# Patient Record
Sex: Female | Born: 1960 | Race: Black or African American | Hispanic: No | Marital: Single | Smoking: Current some day smoker
Health system: Southern US, Community
[De-identification: ages and names within clinical notes are randomized; demographics above are authoritative.]

## PROBLEM LIST (undated history)

## (undated) DIAGNOSIS — F1010 Alcohol abuse, uncomplicated: Secondary | ICD-10-CM

---

## 2001-03-07 ENCOUNTER — Emergency Department (HOSPITAL_COMMUNITY): Admission: EM | Admit: 2001-03-07 | Discharge: 2001-03-07 | Payer: Self-pay | Admitting: Emergency Medicine

## 2001-03-07 ENCOUNTER — Encounter: Payer: Self-pay | Admitting: Emergency Medicine

## 2003-02-20 ENCOUNTER — Emergency Department (HOSPITAL_COMMUNITY): Admission: EM | Admit: 2003-02-20 | Discharge: 2003-02-20 | Payer: Self-pay | Admitting: Internal Medicine

## 2003-07-19 ENCOUNTER — Encounter: Payer: Self-pay | Admitting: Internal Medicine

## 2003-07-19 ENCOUNTER — Emergency Department (HOSPITAL_COMMUNITY): Admission: EM | Admit: 2003-07-19 | Discharge: 2003-07-19 | Payer: Self-pay | Admitting: Internal Medicine

## 2003-08-06 ENCOUNTER — Ambulatory Visit (HOSPITAL_COMMUNITY): Admission: RE | Admit: 2003-08-06 | Discharge: 2003-08-06 | Payer: Self-pay | Admitting: Orthopaedic Surgery

## 2005-02-27 ENCOUNTER — Emergency Department (HOSPITAL_COMMUNITY): Admission: EM | Admit: 2005-02-27 | Discharge: 2005-02-27 | Payer: Self-pay | Admitting: Emergency Medicine

## 2005-03-20 ENCOUNTER — Emergency Department (HOSPITAL_COMMUNITY): Admission: EM | Admit: 2005-03-20 | Discharge: 2005-03-20 | Payer: Self-pay | Admitting: Emergency Medicine

## 2005-03-22 ENCOUNTER — Emergency Department (HOSPITAL_COMMUNITY): Admission: EM | Admit: 2005-03-22 | Discharge: 2005-03-22 | Payer: Self-pay | Admitting: Emergency Medicine

## 2005-03-24 ENCOUNTER — Inpatient Hospital Stay (HOSPITAL_COMMUNITY): Admission: EM | Admit: 2005-03-24 | Discharge: 2005-03-27 | Payer: Self-pay | Admitting: Emergency Medicine

## 2007-10-30 ENCOUNTER — Emergency Department (HOSPITAL_COMMUNITY): Admission: EM | Admit: 2007-10-30 | Discharge: 2007-10-30 | Payer: Self-pay | Admitting: Emergency Medicine

## 2008-09-08 ENCOUNTER — Emergency Department (HOSPITAL_COMMUNITY): Admission: EM | Admit: 2008-09-08 | Discharge: 2008-09-08 | Payer: Self-pay | Admitting: Emergency Medicine

## 2009-04-03 ENCOUNTER — Emergency Department (HOSPITAL_COMMUNITY): Admission: EM | Admit: 2009-04-03 | Discharge: 2009-04-03 | Payer: Self-pay | Admitting: Emergency Medicine

## 2011-02-05 ENCOUNTER — Emergency Department (HOSPITAL_COMMUNITY)
Admission: EM | Admit: 2011-02-05 | Discharge: 2011-02-05 | Disposition: A | Discharge status: Home / Self Care | Payer: Self-pay | Attending: Emergency Medicine | Admitting: Emergency Medicine

## 2011-02-05 DIAGNOSIS — L089 Local infection of the skin and subcutaneous tissue, unspecified: Secondary | ICD-10-CM | POA: Insufficient documentation

## 2011-02-05 DIAGNOSIS — L299 Pruritus, unspecified: Secondary | ICD-10-CM | POA: Insufficient documentation

## 2011-02-13 ENCOUNTER — Emergency Department (HOSPITAL_COMMUNITY)
Admission: EM | Admit: 2011-02-13 | Discharge: 2011-02-13 | Disposition: A | Discharge status: Home / Self Care | Payer: Self-pay | Attending: Emergency Medicine | Admitting: Emergency Medicine

## 2011-02-13 DIAGNOSIS — N644 Mastodynia: Secondary | ICD-10-CM | POA: Insufficient documentation

## 2011-02-15 ENCOUNTER — Other Ambulatory Visit (HOSPITAL_COMMUNITY): Payer: Self-pay

## 2011-02-19 ENCOUNTER — Emergency Department (HOSPITAL_COMMUNITY)
Admission: EM | Admit: 2011-02-19 | Discharge: 2011-02-19 | Disposition: A | Discharge status: Home / Self Care | Payer: Self-pay | Attending: Emergency Medicine | Admitting: Emergency Medicine

## 2011-02-19 DIAGNOSIS — N61 Mastitis without abscess: Secondary | ICD-10-CM | POA: Insufficient documentation

## 2011-02-19 DIAGNOSIS — R599 Enlarged lymph nodes, unspecified: Secondary | ICD-10-CM | POA: Insufficient documentation

## 2011-02-26 NOTE — H&P (Signed)
Rebecca Hurst, Rebecca Hurst                ACCOUNT NO.:  [id number]   MEDICAL RECORD NO.:  [id number]          PATIENT TYPE:  EMS   LOCATION:  ED                            FACILITY:  [facility]   PHYSICIAN:  Osvaldo Shipper, MD     DATE OF BIRTH:  1961/09/06   DATE OF ADMISSION:  03/24/2005  DATE OF DISCHARGE:  LH                                HISTORY & PHYSICAL   ADMISSION DIAGNOSES:  1.  Left breast abscess/cellulitis.  2.  Alcohol abuse.   FAMILY MEDICAL DOCTOR:  Dr. Elpidio Anis   CHIEF COMPLAINT:  Pain, left breast for about two weeks.   HISTORY OF PRESENT ILLNESS:  The patient is a 50 year old African-American  female with a history of alcohol abuse and no other medical problems who  actually presented to the emergency department on 5/20 complaining of pain  and swelling in her left breast.  According to the patient, she noticed some  pain in the upper part of her left breast and noticed that she had about a 2  cm mass on that side.  The mass was tender to palpation.  She presented to  the ED where she was prescribed Keflex and was asked to follow up with Dr.  [name] who is her primary care doctor.  However, the patient did not fill her  prescriptions and returned to the emergency department on 6/10, once again  complaining of pain in the left breast as well as increase in the swelling  of the lesion.  Once again she was prescribed an antibiotic which again she  did not fill.  She returned again on 6/12 resulting in a similar outcome.   The patient returned today because her swelling seemed to have gotten worse.  The pain had increased substantially and this morning the swelling burst and  she had pus discharge with a little bit of blood in it.  The patient had not  noticed any fever or chills at home.  The patient gives a history of similar  lesions on the right side a few years ago requiring incision and drainage by  Dr. [name].  The patient stated her last mammogram was in 1998 when she  was  in a prison and has not had one since then.  The patient has not noticed any  other lymphadenopathy anywhere else.  She denies any weight loss at this  time.   The patient also denies any IV drug use or any other form of needle stick.  She does not have a history of diabetes and has never been told that she has  high sugars.   MEDICATIONS:  She is on no medications currently.  As mentioned above she  was supposed to take Keflex and clindamycin however, she has not been taking  them.   ALLERGIES:  The patient is not allergic to any medications.   PAST MEDICAL HISTORY:  Quite unremarkable, otherwise.  She does have a  history of alcohol use in significant quantities in the past.   SOCIAL HISTORY:  The patient lives with her sister.  She is  currently  unemployed.  She lives in [place].  She smokes about a pack of cigarettes  per day and has about a 40 pack-year history of smoking.  She states she  drinks beer however, she mentioned that she has not had any beer in the last  10 days or so.  She denies any other liquor use.  She denies any illicit  drug.   FAMILY HISTORY:  Significant for a father who died at the age of 65 for what  she thinks was tuberculosis.  She also has a mother who is alive with  hypertension and stroke.  No history of any cancer.  The patient does not  have any other family history of cancer in her primary or secondary family.  No history of any lung disease or heart disease in the family.   REVIEW OF SYSTEMS:  A 10-point review of systems was done which was negative  and unremarkable.   PHYSICAL EXAMINATION:  VITAL SIGNS: Temperature is 98.4, blood pressure  133/76, heart rate of 73, respiratory rate 16, saturating 98% on room air.  GENERAL EXAM: This is an obese African-American female in slight discomfort  because of the pain in her left breast.  HEENT: There is no pilonidal icterus.  Oral mucous membrane is moist, no  oral lesions were seen.   LUNGS: Clear to auscultation bilaterally.  No wheezing, crackles or rhonchi  are heard.  CARDIOVASCULAR: S1 and S2 is normal, regular, no murmurs appreciated.  No  bruits are heard.  No JVD is seen.  LYMPHATIC SYSTEM: No axillary cervical or supraclavicular lymphadenopathy is  appreciated.  ABDOMEN: Soft, nontender, nondistended, obese, bowel sounds are present.  No  organomegaly or masses present.  EXTREMITIES: Without edema.  Peripheral pulses are all palpable.  BREAST EXAM: The left breast was noted to be swollen and tender to  palpation.  There was an area of erythema seen surrounding an open wound  covered by black necrotic area.  I cannot appreciate any discharge at this  time however, the patient is exquisitely tender to palpation.  I cannot  appreciate any delineated mass.  No fluctuation is appreciated.  The whole  area measures about 8 cm x 10 cm.  Right breast exam was also done which  showed an old scar from a previous I&D.  Otherwise no tenderness was  present, no nodes or masses were appreciated.   LABORATORY STUDIES:  At this time the CBC is available which shows a white  count of 9.3 with a normal differential.  Hemoglobin is 12.2 and MCV 102.2,  platelet count of 325.  BMP, Culture reports and alcohol level are all  pending at this time.  No imaging studies have been done.   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  The patient  had been prescribed antibiotics during her previous ER visits however, she  has been noncompliant with the same.  Obviously the patient has a history of  recurrent abscess and thought should be given to the patient having glucose intolerance or diabetes.  Other serious considerations in this patient also   include inflammatory breast cancer.  Her last mammogram as per the patient  was in 1998.  I will discuss this with Dr. [name] to  see if any recent  mammograms are available.   PLAN:  1.  Left breast abscess.  The patient is currently afebrile however, she      does have that obvious abscess for which we will prescribe cefazolin 1 g      IV q.8.  We will consult Dr. [name] to see if this abscess can be      surgically drained.  Cultures have been sent from the ER.  Consideration      will be given to obtaining an imaging study of the breast in the form of      either a mammogram or an MRI, when current issue has stabilized.  This      will be discussed with Dr. [name].   1.  Alcohol abuse.  The patient states her last drink was about 10 days ago      however, the patient is not completely reliable.  I will put her on      telemetry and DT precautions.  I will replace thiamine, folate and      multivitamins in this patient.  Ativan will be prescribed on a p.r.n.      basis.   1.  The patient is ambulatory, no DVT prophylaxis is needed at this time.   Further management decisions will be based on the results of initial testing  and patient's response to treatment.       GK/MEDQ  D:  03/24/2005  T:  03/24/2005  Job:  [id number]   cc:   Dirk Dress. [name], M.D.  [address]  [place]  [place] [address]  Fax: [phone]

## 2011-02-26 NOTE — Op Note (Signed)
NAMETAMELIA, MICHALOWSKI                ACCOUNT NO.:  [id number]   MEDICAL RECORD NO.:  [id number]          PATIENT TYPE:  INP   LOCATION:  A224                          FACILITY:  [facility]   PHYSICIAN:  Dirk Dress. [name], M.D.   DATE OF BIRTH:  May 13, 1961   DATE OF PROCEDURE:  03/26/2005  DATE OF DISCHARGE:                                 OPERATIVE REPORT   PREOPERATIVE DIAGNOSIS:  Left breast abscess.   POSTOPERATIVE DIAGNOSIS:  Left breast abscess.   PROCEDURE:  Incision and drainage of left breast abscess.   SURGEON:  Dr. [name].   DESCRIPTION:  Under general anesthesia, the left breast was prepped and  draped in a sterile field. The cavity was already draining. The area of  purulence was debrided and irrigated. Necrotic tissue was removed. The area  was packed with saline moistened gauze. Sterile dressing was placed. The  patient was awakened from anesthesia uneventfully and transferred to a bed  and taken to the post-anesthesia care unit for monitoring.       LCS/MEDQ  D:  03/26/2005  T:  03/26/2005  Job:  [id number]

## 2011-02-26 NOTE — Discharge Summary (Signed)
NAME:  Rebecca Hurst, Rebecca Hurst                ACCOUNT NO.:  [id number]   MEDICAL RECORD NO.:  [id number]          PATIENT TYPE:  INP   LOCATION:  A224                          FACILITY:  [facility]   PHYSICIAN:  Toby L. Fugate, D.O.   DATE OF BIRTH:  03/07/1961   DATE OF ADMISSION:  03/24/2005  DATE OF DISCHARGE:  06/17/2006LH                                 DISCHARGE SUMMARY   DISCHARGE DIAGNOSES:  Left breast abscess, status post incision and  drainage.   CONSULTATIONS:  Surgery.   PROCEDURE:  Incision and drainage.   HISTORY OF PRESENT ILLNESS/HOSPITAL COURSE:  Ms. Umholtz is a 50 year old  African-American female with a past medical history of alcohol abuse, who  presented to the emergency room on Feb 27, 2005 complaining of pain and  swelling of her left breast. At that time, she was prescribed Keflex and  scheduled to followup with her primary care physician, Dr. [name]. The  patient did not have the prescription filled and subsequently the area  worsened. She presented to the emergency room again on March 20, 2005  complaining of left breast swelling and pain. Again, she was prescribed  antibiotics. She once again did not have the antibiotic filled. The patient  returned to the emergency room on March 24, 2005. At that time, the swelling  had become significantly worse. The area was draining purulent material. The  patient was subsequently admitted. She was started on Cefazolin. Prior to  administration of antibiotics, purulent material was sent for culture.  Culture grew multiple organisms, none predominant. Blood cultures were  negative. On March 26, 2005, the patient went to surgery for incision and  drainage. No intraoperative cultures were obtained. The patient was  continued of Cefazolin. The area seemed to improve. On March 27, 2005, the  patient was felt to be stable for discharge.   CONDITION ON DISCHARGE:  Stable.   DISPOSITION:  The patient will be discharged home. She is to followup  with  Dr. [name] as described below.   DISCHARGE MEDICATIONS:  Keflex 500 mg 1 p.o. q.i.d. for 14 days.   FOLLOW UP:  The patient will followup with Dr. [name] on Monday, March 29, 2005. At that time, she will have the packing changed.       TLF/MEDQ  D:  03/27/2005  T:  03/27/2005  Job:  [id number]   cc:   Dario Guardian, M.D.  [address]  [place]  [place] [address]  Fax: [phone]

## 2011-05-23 ENCOUNTER — Emergency Department (HOSPITAL_COMMUNITY)
Admission: EM | Admit: 2011-05-23 | Discharge: 2011-05-23 | Disposition: A | Discharge status: Home / Self Care | Payer: Self-pay | Attending: Emergency Medicine | Admitting: Emergency Medicine

## 2011-05-23 ENCOUNTER — Emergency Department (HOSPITAL_COMMUNITY): Payer: Self-pay

## 2011-05-23 DIAGNOSIS — F17200 Nicotine dependence, unspecified, uncomplicated: Secondary | ICD-10-CM | POA: Insufficient documentation

## 2011-05-23 DIAGNOSIS — M546 Pain in thoracic spine: Secondary | ICD-10-CM | POA: Insufficient documentation

## 2011-05-23 DIAGNOSIS — R42 Dizziness and giddiness: Secondary | ICD-10-CM | POA: Insufficient documentation

## 2011-05-23 LAB — BASIC METABOLIC PANEL
BUN: 9 mg/dL (ref 6–23)
CO2: 22 mEq/L (ref 19–32)
Chloride: 106 mEq/L (ref 96–112)
Glucose, Bld: 103 mg/dL — ABNORMAL HIGH (ref 70–99)
Potassium: 4 mEq/L (ref 3.5–5.1)
Sodium: 139 mEq/L (ref 135–145)

## 2011-05-23 MED ORDER — TRAMADOL HCL 50 MG PO TABS: 50.0000 mg | ORAL_TABLET | Freq: Four times a day (QID) | ORAL | Status: AC | PRN

## 2011-05-23 MED ORDER — IOHEXOL 300 MG/ML  SOLN
100.0000 mL | Freq: Once | INTRAMUSCULAR | Status: AC | PRN
  Administered 2011-05-23: 100 mL via INTRAVENOUS

## 2011-05-23 MED ORDER — HYDROCODONE-ACETAMINOPHEN 5-325 MG PO TABS
2.0000 | ORAL_TABLET | Freq: Once | ORAL | Status: AC
  Administered 2011-05-23: 2 via ORAL
  Filled 2011-05-23: qty 2

## 2011-05-23 NOTE — ED Provider Notes (Signed)
Scribed for Glynn Octave, MD, the patient was seen in room APA19/[facility]. This chart was scribed by [employer]. The patient's care started at 7:25AM   CSN: [id number] Arrival date & time: 05/23/2011  7:20 AM  Chief Complaint  Patient presents with  . Back Pain   HPI Rebecca Hurst is a 50 y.o. female who presents to the Emergency Department complaining of one month of constant right mid/upper back pain. Patient was possibly bit by an insect ~1 month ago. She developed an itchy painful lesion to the right upper back and since the pain has been constant. She reports back pain is exacerbated by deep breathing and denies any alleviating factors. Tonight she couldn't sleep due to pain. Denies any recent injury, heavy lifting, or twisting. Denies any associated fever, nausea, vomiting, chest pain, shortness of breath, weakness/numbness in extremities, or bowel/bladder incontinence. Additionally reports intermittent dizziness. Denies a history of diabetes mellitus, back problems, or chronic medical problems. There are no other associated symptoms and no other alleviating or aggravating factors.    HPI ELEMENTS:  Location: right mid/upper back  Onset: 1 month Timing: constant Severity: 9/10 Modifying factors: aggravated by deep breathing. Context: as above  Associated symptoms: as above     PAST MEDICAL HISTORY:  History reviewed. No pertinent past medical history.   PAST SURGICAL HISTORY:  History reviewed. No pertinent past surgical history.   MEDICATIONS:  Previous Medications   No medications on file     ALLERGIES:  Allergies as of 05/23/2011  . (No Known Allergies)     FAMILY HISTORY:  No Pertinent Family History   SOCIAL HISTORY: Arrives alone  History  Substance Use Topics  . Smoking status: Current Everyday Smoker  . Smokeless tobacco: Not on file  . Alcohol Use: No    Review of Systems  Constitutional: Negative for fever and diaphoresis.  Respiratory:  Negative for shortness of breath.   Cardiovascular: Negative for chest pain.  Gastrointestinal: Negative for nausea, vomiting, diarrhea and blood in stool.  Musculoskeletal: Positive for back pain.  Skin: Negative for color change.  Neurological: Positive for dizziness. Negative for weakness and headaches.       Intermittent  All other systems reviewed and are negative.    Physical Exam   ED Triage Vitals  Enc Vitals Group     BP 05/23/11 0712 155/74 mmHg     Pulse Rate 05/23/11 0712 64      Resp 05/23/11 0712 18      Temp 05/23/11 0712 98.1 F (36.7 C)     Temp src 05/23/11 0712 Oral     SpO2 05/23/11 0712 98 %     Pain Score 05/23/11 0712 Nine   Physical Exam  Nursing note reviewed. Constitutional: She is oriented to person, place, and time. She appears well-developed.       Tearful and anxious  Eyes: Conjunctivae are normal. Pupils are equal, round, and reactive to light.  Neck: Normal range of motion. Neck supple.  Cardiovascular: Normal rate, regular rhythm and normal heart sounds.   Pulmonary/Chest: Effort normal and breath sounds normal. No respiratory distress. She has no wheezes.  Abdominal: Soft. Bowel sounds are normal. There is no tenderness. There is CVA tenderness. There is no rebound and no guarding.  Musculoskeletal:       Cervical back: Normal. She exhibits no tenderness.       Thoracic back: Normal. She exhibits no tenderness.       Lumbar back: Normal. She exhibits  no tenderness.       Paraspinal and thoracic tenderness  Neurological: She is alert and oriented to person, place, and time. She has normal reflexes. She displays normal reflexes. No cranial nerve deficit.  Skin: Skin is warm and dry. She is not diaphoretic.     Psychiatric: Her mood appears anxious.   Procedures  OTHER DATA REVIEWED: Nursing notes, vital signs, and past medical records reviewed.  DIAGNOSTIC STUDIES: Oxygen Saturation is 98% on room air, normal by my interpretation.      LABS / RADIOLOGY: Results for orders placed during the hospital encounter of 05/23/11  D-DIMER, QUANTITATIVE      Component Value Range   D-Dimer, Quant 0.58 (*) 0.00 - 0.48 (ug/mL-FEU)  BASIC METABOLIC PANEL      Component Value Range   Sodium 139  135 - 145 (mEq/L)   Potassium 4.0  3.5 - 5.1 (mEq/L)   Chloride 106  96 - 112 (mEq/L)   CO2 22  19 - 32 (mEq/L)   Glucose, Bld 103 (*) 70 - 99 (mg/dL)   BUN 9  6 - 23 (mg/dL)   Creatinine, Ser 4.09  0.50 - 1.10 (mg/dL)   Calcium 9.3  8.4 - 81.1 (mg/dL)   GFR calc non Af Amer >60  >60 (mL/min)   GFR calc Af Amer >60  >60 (mL/min)    Dg Chest 2 View interpreted by Dr Oley Balm III. Impression: No acute disease.  CTA chest: Interpreted by Radiologist Dr. Charline Bills. Impression no evidence of Pulmonary embolism.   ED COURSE / COORDINATION OF CARE: 7:40AM - EDMD examined patient and ordered 2v DG Chest and D-dimer, quantitative. 9:15 AM - D-Dimer returned and is elevated. CTA chest ordered. 9:17 AM - EDMD discussed results with patient who consents to CT. 11:15 AM - CTA chest shows no PE. ED physician discussed results with the patient. Patient stable for discharge.    MDM: Paraspinal thoracic back pain attributed to "bug bite" several months ago.  No neuro symptoms.  Worsening pain with breathing. With age, will check Ddimer.   IMPRESSION: Diagnoses that have been ruled out:  Diagnoses that are still under consideration:  Final diagnoses:  Thoracic back pain    PLAN:  Home  The patient is to return the emergency department if there is any worsening of symptoms. I have reviewed the discharge instructions with the patient    CONDITION ON DISCHARGE: Stable   MEDICATIONS GIVEN IN THE E.D.  Medications  traMADol (ULTRAM) 50 MG tablet (not administered)  HYDROcodone-acetaminophen (NORCO) 5-325 MG per tablet 2 tablet (2 tablet Oral Given 05/23/11 0746)  iohexol (OMNIPAQUE) 300 MG/ML injection 100 mL (100 mL  Intravenous Contrast Given 05/23/11 1106)     DISCHARGE MEDICATIONS: New Prescriptions   No medications on file    I personally performed the services described in this documentation, which was scribed in my presence.  The recorded information has been reviewed and considered.      Glynn Octave, MD 05/23/11 [phone]

## 2011-05-23 NOTE — ED Notes (Signed)
Pt reports generalized back pain for about a month.  Pt states that she was here a few weeks ago and was given hydrocodone for her pain, but she is out now and her pain is still there.  Pt states "it hurts when i breath".  nad noted

## 2012-02-10 ENCOUNTER — Emergency Department (HOSPITAL_COMMUNITY)
Admission: EM | Admit: 2012-02-10 | Discharge: 2012-02-10 | Disposition: A | Discharge status: Home / Self Care | Payer: Self-pay | Attending: Emergency Medicine | Admitting: Emergency Medicine

## 2012-02-10 ENCOUNTER — Encounter (HOSPITAL_COMMUNITY): Payer: Self-pay | Admitting: *Deleted

## 2012-02-10 DIAGNOSIS — M549 Dorsalgia, unspecified: Secondary | ICD-10-CM

## 2012-02-10 DIAGNOSIS — M546 Pain in thoracic spine: Secondary | ICD-10-CM | POA: Insufficient documentation

## 2012-02-10 HISTORY — DX: Alcohol abuse, uncomplicated: F10.10

## 2012-02-10 MED ORDER — HYDROCODONE-ACETAMINOPHEN 5-325 MG PO TABS
2.0000 | ORAL_TABLET | Freq: Once | ORAL | Status: AC
  Administered 2012-02-10: 2 via ORAL
  Filled 2012-02-10: qty 2

## 2012-02-10 MED ORDER — HYDROCODONE-ACETAMINOPHEN 5-325 MG PO TABS: 2.0000 | ORAL_TABLET | ORAL | Status: AC | PRN

## 2012-02-10 NOTE — ED Provider Notes (Addendum)
History     CSN: [id number]  Arrival date & time 02/10/12  1814   First MD Initiated Contact with Patient 02/10/12 1828      Chief Complaint  Patient presents with  . Back Pain    (Consider location/radiation/quality/duration/timing/severity/associated sxs/prior treatment) HPI Complains of right-sided posterior thoracic back pain onset one month ago pain is constant worse with deep breathing or changing positions. Treated with extra Tylenol, without adequate pain relief no abdominal pain, no chest pain, no fever, no incontinence no nausea no shortness of breath no other complaint pain is moderate to severe nonradiating sharp in quality no other associated symptoms Past Medical History  Diagnosis Date  . Alcohol abuse    last used alcohol 5 years ago. Patient with very similar presentation of back pain August 2012 .Had CT angio of chest which was negative  History reviewed. No pertinent past surgical history.  No family history on file.  History  Substance Use Topics  . Smoking status: Current Everyday Smoker  . Smokeless tobacco: Not on file  . Alcohol Use: No   former abuser of alcohol last used alcohol 5 years ago  OB History    Grav Para Term Preterm Abortions TAB SAB Ect Mult Living                  Review of Systems  Constitutional: Negative.   HENT: Negative.   Respiratory: Negative.   Cardiovascular: Negative.   Gastrointestinal: Negative.   Musculoskeletal: Positive for back pain.  Skin: Negative.   Neurological: Negative.   Hematological: Negative.   Psychiatric/Behavioral: Negative.   All other systems reviewed and are negative.    Allergies  Review of patient's allergies indicates no known allergies.  Home Medications  No current outpatient prescriptions on file.  BP 168/87  Pulse 64  Temp(Src) 98.6 F (37 C) (Oral)  Resp 16  Ht 5\' 3"  (1.6 m)  Wt 180 lb (81.647 kg)  BMI 31.89 kg/m2  SpO2 100%  LMP 01/28/2012  Physical Exam  Nursing  note and vitals reviewed. Constitutional: She is oriented to person, place, and time. She appears well-developed and well-nourished.  HENT:  Head: Normocephalic and atraumatic.  Eyes: Conjunctivae are normal. Pupils are equal, round, and reactive to light.  Neck: Neck supple. No tracheal deviation present. No thyromegaly present.  Cardiovascular: Normal rate and regular rhythm.   No murmur heard. Pulmonary/Chest: Effort normal and breath sounds normal.  Abdominal: Soft. Bowel sounds are normal. She exhibits no distension. There is no tenderness.  Musculoskeletal: Normal range of motion. She exhibits no edema and no tenderness.       Entire spine is nontender, pain at right upper parathoracic area when she sits up from a supine position  Neurological: She is alert and oriented to person, place, and time. She displays normal reflexes. No cranial nerve deficit. Coordination normal.       Gait normal  Skin: Skin is warm and dry. No rash noted.  Psychiatric: She has a normal mood and affect.    ED Course  Procedures (including critical care time)  Labs Reviewed - No data to display No results found.   No diagnosis found.    MDM  Pain likely musculoskeletal in etiology Plan prescription hydrocodone-apap Blood pressure recheck 3 weeks Referral local health department Diagnosis #1 back pain  #2 elevated blood pressure         Doug Sou, MD 02/10/12 1903  Doug Sou, MD 02/10/12 2101

## 2012-02-10 NOTE — Discharge Instructions (Signed)
Back Pain, Adult Back pain is very common. The pain often gets better over time. The cause of back pain is usually not dangerous. Most people can learn to manage their back pain on their own.  HOME CARE   Stay active. Start with short walks on flat ground if you can. Try to walk farther each day.   Do not sit, drive, or stand in one place for more than 30 minutes. Do not stay in bed.   Do not avoid exercise or work. Activity can help your back heal faster.   Be careful when you bend or lift an object. Bend at your knees, keep the object close to you, and do not twist.   Sleep on a firm mattress. Lie on your side, and bend your knees. If you lie on your back, put a pillow under your knees.   Only take medicines as told by your doctor.   Put ice on the injured area.   Put ice in a plastic bag.   Place a towel between your skin and the bag.   Leave the ice on for 15 to 20 minutes, 3 to 4 times a day for the first 2 to 3 days. After that, you can switch between ice and heat packs.   Ask your doctor about back exercises or massage.   Avoid feeling anxious or stressed. Find good ways to deal with stress, such as exercise.  GET HELP RIGHT AWAY IF:   Your pain does not go away with rest or medicine.   Your pain does not go away in 1 week.   You have new problems.   You do not feel well.   The pain spreads into your legs.   You cannot control when you poop (bowel movement) or pee (urinate).   Your arms or legs feel weak or lose feeling (numbness).   You feel sick to your stomach (nauseous) or throw up (vomit).   You have belly (abdominal) pain.   You feel like you may pass out (faint).  MAKE SURE YOU:   Understand these instructions.   Will watch your condition.   Will get help right away if you are not doing well or get worse.  Document Released: 03/15/2008 Document Revised: 09/16/2011 Document Reviewed: 02/15/2011 [facility] Patient Information 2012 [place],  [place].   Take Tylenol for mild pain or the pain medicine prescribed for bad pain. See your local health department if not better by next week. Your blood pressure should be rechecked within the next 3 weeks. Blood pressure was mildly elevated today at 157/76

## 2012-02-10 NOTE — ED Notes (Signed)
Pain in mid back region states it hurts to breath, states she is unable to lay down to sleep, states she has to be in a sitting position to be comfortable

## 2012-04-29 ENCOUNTER — Emergency Department (HOSPITAL_COMMUNITY)
Admission: EM | Admit: 2012-04-29 | Discharge: 2012-04-29 | Disposition: A | Discharge status: Home / Self Care | Payer: Self-pay | Attending: Emergency Medicine | Admitting: Emergency Medicine

## 2012-04-29 ENCOUNTER — Encounter (HOSPITAL_COMMUNITY): Payer: Self-pay | Admitting: *Deleted

## 2012-04-29 DIAGNOSIS — F17200 Nicotine dependence, unspecified, uncomplicated: Secondary | ICD-10-CM | POA: Insufficient documentation

## 2012-04-29 DIAGNOSIS — L02211 Cutaneous abscess of abdominal wall: Secondary | ICD-10-CM

## 2012-04-29 DIAGNOSIS — L03319 Cellulitis of trunk, unspecified: Secondary | ICD-10-CM | POA: Insufficient documentation

## 2012-04-29 DIAGNOSIS — L0390 Cellulitis, unspecified: Secondary | ICD-10-CM

## 2012-04-29 DIAGNOSIS — L02219 Cutaneous abscess of trunk, unspecified: Secondary | ICD-10-CM | POA: Insufficient documentation

## 2012-04-29 LAB — CBC WITH DIFFERENTIAL/PLATELET
Basophils Relative: 0 % (ref 0–1)
Eosinophils Absolute: 0.2 10*3/uL (ref 0.0–0.7)
Eosinophils Relative: 2 % (ref 0–5)
HCT: 33.3 % — ABNORMAL LOW (ref 36.0–46.0)
Hemoglobin: 11.3 g/dL — ABNORMAL LOW (ref 12.0–15.0)
Lymphs Abs: 3.1 10*3/uL (ref 0.7–4.0)
MCH: 32.5 pg (ref 26.0–34.0)
MCHC: 33.9 g/dL (ref 30.0–36.0)
MCV: 95.7 fL (ref 78.0–100.0)
Monocytes Absolute: 0.8 10*3/uL (ref 0.1–1.0)
Monocytes Relative: 8 % (ref 3–12)
Neutrophils Relative %: 57 % (ref 43–77)
RBC: 3.48 MIL/uL — ABNORMAL LOW (ref 3.87–5.11)

## 2012-04-29 LAB — BASIC METABOLIC PANEL
BUN: 11 mg/dL (ref 6–23)
Creatinine, Ser: 0.76 mg/dL (ref 0.50–1.10)
GFR calc non Af Amer: >90 mL/min (ref >90)
Glucose, Bld: 93 mg/dL (ref 70–99)
Potassium: 3.4 mEq/L — ABNORMAL LOW (ref 3.5–5.1)

## 2012-04-29 MED ORDER — LIDOCAINE HCL (PF) 2 % IJ SOLN
10.0000 mL | Freq: Once | INTRAMUSCULAR | Status: AC
  Administered 2012-04-29: 10 mL
  Filled 2012-04-29: qty 10

## 2012-04-29 MED ORDER — VANCOMYCIN HCL IN DEXTROSE 1-5 GM/200ML-% IV SOLN
1000.0000 mg | Freq: Once | INTRAVENOUS | Status: AC
  Administered 2012-04-29: 1000 mg via INTRAVENOUS
  Filled 2012-04-29: qty 200

## 2012-04-29 MED ORDER — SULFAMETHOXAZOLE-TRIMETHOPRIM 800-160 MG PO TABS: 1.0000 | ORAL_TABLET | Freq: Two times a day (BID) | ORAL | Status: AC

## 2012-04-29 MED ORDER — OXYCODONE-ACETAMINOPHEN 5-325 MG PO TABS: 1.0000 | ORAL_TABLET | ORAL | Status: AC | PRN

## 2012-04-29 NOTE — ED Notes (Signed)
PA in with pt now 

## 2012-04-29 NOTE — ED Provider Notes (Signed)
History     CSN: [id number]  Arrival date & time 04/29/12  1302   First MD Initiated Contact with Patient 04/29/12 1406      Chief Complaint  Patient presents with  . Wound Check    (Consider location/radiation/quality/duration/timing/severity/associated sxs/prior treatment) HPI Comments: Rebecca Hurst presents with 2 complaints,  The first being a skin infection on her left lower abdomen which has been present for 3 days.  She noted a pus filled blister today which burst and has been draining pus and clear fluid.  She denies fevers,  Chills,  Nausea or vomiting.  She does have a history of other abscess,  Most notably a large abscess on her left breast several years ago.  She also has complaint of acute on chronic right upper back pain which is worse with palpation,  Certain positions and with range of motion of her right shoulder.  She denies any injury or fall.    The history is provided by the patient.    Past Medical History  Diagnosis Date  . Alcohol abuse     History reviewed. No pertinent past surgical history.  History reviewed. No pertinent family history.  History  Substance Use Topics  . Smoking status: Current Everyday Smoker    Types: Cigarettes  . Smokeless tobacco: Not on file  . Alcohol Use: No    OB History    Grav Para Term Preterm Abortions TAB SAB Ect Mult Living                  Review of Systems  Constitutional: Negative for fever.  HENT: Negative for congestion, sore throat and neck pain.   Eyes: Negative.   Respiratory: Negative for chest tightness and shortness of breath.   Cardiovascular: Negative for chest pain.  Gastrointestinal: Negative for nausea and abdominal pain.  Genitourinary: Negative.   Musculoskeletal: Positive for back pain. Negative for joint swelling.  Skin: Positive for color change and wound. Negative for rash.  Neurological: Negative for dizziness, weakness, light-headedness, numbness and headaches.  Hematological:  Negative.   Psychiatric/Behavioral: Negative.     Allergies  Review of patient's allergies indicates no known allergies.  Home Medications   Current Outpatient Rx  Name Route Sig Dispense Refill  . ACETAMINOPHEN 500 MG PO TABS Oral Take 1,000 mg by mouth as needed. For pain    . OXYCODONE-ACETAMINOPHEN 5-325 MG PO TABS Oral Take 1 tablet by mouth every 4 (four) hours as needed for pain. 15 tablet 0  . SULFAMETHOXAZOLE-TRIMETHOPRIM 800-160 MG PO TABS Oral Take 1 tablet by mouth 2 (two) times daily. 28 tablet 0    BP 141/74  Pulse 75  Temp 98.6 F (37 C) (Oral)  Resp 18  Ht 5\' 3"  (1.6 m)  Wt 180 lb (81.647 kg)  BMI 31.89 kg/m2  SpO2 97%  LMP 03/05/2012  Physical Exam  Constitutional: She appears well-developed and well-nourished. No distress.  HENT:  Head: Normocephalic.  Neck: Neck supple.  Cardiovascular: Normal rate.   Pulmonary/Chest: Effort normal. No respiratory distress. She has no wheezes. She exhibits no tenderness.  Musculoskeletal: Normal range of motion. She exhibits tenderness. She exhibits no edema.       Thoracic back: She exhibits tenderness. She exhibits no edema and no deformity.       TTP right upper parathoracic area with no midline tenderness.  No rash or lesions,  No swelling.  Pain is increased with ROM of right shoulder,  There is no ttp along  shoulder joint and upper right humerus.  Skin: Rash noted.       Abscess which is indurated with ruptured bulla left lower abdomen with scant clear drainage.  There is a 23 cm x 12 cm area of surrounding tender erythema.    ED Course  Procedures (including critical care time)  Labs Reviewed  CBC WITH DIFFERENTIAL - Abnormal; Notable for the following:    RBC 3.48 (*)     Hemoglobin 11.3 (*)     HCT 33.3 (*)     All other components within normal limits  BASIC METABOLIC PANEL - Abnormal; Notable for the following:    Potassium 3.4 (*)     All other components within normal limits  WOUND CULTURE   No  results found.   1. Abscess of skin of abdomen   2. Cellulitis    Informal bedside US revealing for small fluid filled pocket very superficial at site of ruptured bulla.    INCISION AND DRAINAGE Performed by: Burgess Amor Consent: Verbal consent obtained. Risks and benefits: risks, benefits and alternatives were discussed Type: abscess  Body area: abdominal wall  Anesthesia: local infiltration  Local anesthetic: lidocaine 2% without epinephrine  Anesthetic total: 7 ml  Complexity: complex Blunt dissection to break up loculations  Drainage: bloody  Drainage amount: moderate  Packing material: 1/4 in iodoform gauze  Patient tolerance: Patient tolerated the procedure well with no immediate complications.      MDM  Pts labs reviewed prior to dc home.  She was given IV vancomycin 1 gram.  She was instructed to return here tomorrow for a recheck of her infection site.  Prescribed bactrim,  Percocet.  Dressing applied to abscess site and wound culture sent.  Suspect back pain is musculoskeletal given point ttp and reproducible.  Heat pad,  Hydrocodone,  Ibuprofen.          Burgess Amor, [place] 04/29/12 [phone]

## 2012-04-29 NOTE — ED Notes (Addendum)
Pt c/o pain in her left upper back off and on for several months. Also c/o wound that "busted open" this am on left lower abdomen. States that it has been there for 3 days.

## 2012-04-29 NOTE — ED Notes (Signed)
Pt with an abscess to left lower abdomen x 3 days, redness has spread several inches outward from abscess

## 2012-04-30 ENCOUNTER — Encounter (HOSPITAL_COMMUNITY): Payer: Self-pay | Admitting: Emergency Medicine

## 2012-04-30 ENCOUNTER — Emergency Department (HOSPITAL_COMMUNITY)
Admission: EM | Admit: 2012-04-30 | Discharge: 2012-04-30 | Disposition: A | Discharge status: Home / Self Care | Payer: Self-pay | Attending: Emergency Medicine | Admitting: Emergency Medicine

## 2012-04-30 DIAGNOSIS — L03319 Cellulitis of trunk, unspecified: Secondary | ICD-10-CM | POA: Insufficient documentation

## 2012-04-30 DIAGNOSIS — F17200 Nicotine dependence, unspecified, uncomplicated: Secondary | ICD-10-CM | POA: Insufficient documentation

## 2012-04-30 DIAGNOSIS — L0291 Cutaneous abscess, unspecified: Secondary | ICD-10-CM

## 2012-04-30 DIAGNOSIS — L02219 Cutaneous abscess of trunk, unspecified: Secondary | ICD-10-CM | POA: Insufficient documentation

## 2012-04-30 NOTE — ED Notes (Signed)
Pt aware to come back Wednesday and bring back tube of packing with her. Pt verbalized understanding

## 2012-04-30 NOTE — ED Notes (Signed)
Pt here yesterday and received antibiotics for abd abscess. Back today for recheck. Redness is less red but still same amount around marked area. Pt states feel a lot better. Oozing clear drainage.

## 2012-04-30 NOTE — ED Provider Notes (Signed)
Medical screening examination/treatment/procedure(s) were performed by non-physician practitioner and as supervising physician I was immediately available for consultation/collaboration.   Laray Anger, DO 04/30/12 [phone]

## 2012-05-01 LAB — WOUND CULTURE

## 2012-05-01 NOTE — ED Provider Notes (Signed)
History     CSN: [id number]  Arrival date & time 04/30/12  1404   First MD Initiated Contact with Patient 04/30/12 1426      Chief Complaint  Patient presents with  . Wound Check    (Consider location/radiation/quality/duration/timing/severity/associated sxs/prior treatment) Patient is a 51 y.o. female presenting with wound check. The history is provided by the patient.  Wound Check  She was treated in the ED yesterday. Previous treatment in the ED includes I&D of abscess and IV/IM antibiotics. Treatments since wound repair include oral antibiotics. There has been no drainage from the wound. The redness has not changed. The swelling has improved. The pain has improved.    Past Medical History  Diagnosis Date  . Alcohol abuse     History reviewed. No pertinent past surgical history.  History reviewed. No pertinent family history.  History  Substance Use Topics  . Smoking status: Current Everyday Smoker    Types: Cigarettes  . Smokeless tobacco: Not on file  . Alcohol Use: No    OB History    Grav Para Term Preterm Abortions TAB SAB Ect Mult Living                  Review of Systems  Constitutional: Negative for fever and chills.  Respiratory: Negative for shortness of breath.   Skin: Positive for wound.  Neurological: Negative for weakness.    Allergies  Review of patient's allergies indicates no known allergies.  Home Medications   Current Outpatient Rx  Name Route Sig Dispense Refill  . ACETAMINOPHEN 500 MG PO TABS Oral Take 1,000 mg by mouth as needed. For pain    . OXYCODONE-ACETAMINOPHEN 5-325 MG PO TABS Oral Take 1 tablet by mouth every 4 (four) hours as needed for pain. 15 tablet 0  . SULFAMETHOXAZOLE-TRIMETHOPRIM 800-160 MG PO TABS Oral Take 1 tablet by mouth 2 (two) times daily. 28 tablet 0    BP 134/72  Pulse 86  Temp 98.2 F (36.8 C)  Resp 17  SpO2 98%  LMP 03/05/2012  Physical Exam  Constitutional: She appears well-developed and  well-nourished. No distress.  HENT:  Head: Normocephalic.  Neck: Neck supple.  Cardiovascular: Normal rate and normal heart sounds.   Pulmonary/Chest: Effort normal and breath sounds normal.  Musculoskeletal: Normal range of motion. She exhibits no edema.  Skin: There is erythema.       Persistent erythema left lower abdominal wall but with much less intensity,  Almost to edges of skin marked area, minimal tenderness.  There is dried serous fluid around incision site with no active drainage.  Packing present.    ED Course  Procedures (including critical care time)  Packing was removed and abscess site gently flushed with saline.  There was no purulent drainage and abscess walls appear healthy with new granulation tissue present.  The site was gently repacked loosely,  Patient tolerated well.      Labs Reviewed - No data to display No results found.   1. Abscess       MDM  Pt has had 2 doses of bactrim since yestedays visit.  She feels improved,  There has been no advancement of cellulitis and the erythema present is much lighter in appearance.  Pt encouraged to return in 3 days for packing removal,  But encouraged to return if the redness or pain starts to spread rather than recede as is currently.  Pt understands plan.  Will continue with home abx.  Burgess Amor, [place] 05/01/12 1109

## 2012-05-01 NOTE — ED Provider Notes (Signed)
Medical screening examination/treatment/procedure(s) were performed by non-physician practitioner and as supervising physician I was immediately available for consultation/collaboration.   Keijuan Schellhase M Koreena Joost, DO 05/01/12 1529 

## 2012-05-03 ENCOUNTER — Emergency Department (HOSPITAL_COMMUNITY)
Admission: EM | Admit: 2012-05-03 | Discharge: 2012-05-03 | Disposition: A | Discharge status: Home / Self Care | Payer: Self-pay | Attending: Emergency Medicine | Admitting: Emergency Medicine

## 2012-05-03 ENCOUNTER — Encounter (HOSPITAL_COMMUNITY): Payer: Self-pay | Admitting: Emergency Medicine

## 2012-05-03 DIAGNOSIS — Z5189 Encounter for other specified aftercare: Secondary | ICD-10-CM

## 2012-05-03 DIAGNOSIS — F17200 Nicotine dependence, unspecified, uncomplicated: Secondary | ICD-10-CM | POA: Insufficient documentation

## 2012-05-03 DIAGNOSIS — Z4801 Encounter for change or removal of surgical wound dressing: Secondary | ICD-10-CM | POA: Insufficient documentation

## 2012-05-03 NOTE — ED Notes (Signed)
Pt here for recheck of abscess to abd. States feels a lot better and looks a lot better. Nad.

## 2012-05-03 NOTE — ED Provider Notes (Signed)
History  This chart was scribed for Rebecca Anger, DO by Bennett Scrape. This patient was seen in room [facility]03/[facility].  CSN: [id number]  Arrival date & time 05/03/12  0740   First MD Initiated Contact with Patient 05/03/12 [phone]      Chief Complaint  Patient presents with  . Wound Check     The history is provided by the patient. No language interpreter was used.   Pt was seen at 7:55AM. [name] is a 51 y.o. female who presents to the Emergency Department for gradual onset and improvement of constant left sided abd wall abscess/cellulitis for the past 1 week.  She is here today for a wound recheck. Pt's left lower abd wall abscess was I&D'd in this ED on 04/29/12 and she was rx Septra.  Pt states she has been taking the abx as prescribed.  States the area "feels better" and "looks better."  Denies pain, no redness, no drainage, no fevers, no other areas of rash.    Past Medical History  Diagnosis Date  . Alcohol abuse     History reviewed. No pertinent past surgical history.  History  Substance Use Topics  . Smoking status: Current Everyday Smoker    Types: Cigarettes  . Smokeless tobacco: Not on file  . Alcohol Use: No    No OB history provided.  Review of Systems ROS: Statement: All systems negative except as marked or noted in the HPI; Constitutional: Negative for fever and chills. ; ; Eyes: Negative for eye pain, redness and discharge. ; ; ENMT: Negative for ear pain, hoarseness, nasal congestion, sinus pressure and sore throat. ; ; Cardiovascular: Negative for chest pain, palpitations, diaphoresis, dyspnea and peripheral edema. ; ; Respiratory: Negative for cough, wheezing and stridor. ; ; Gastrointestinal: Negative for nausea, vomiting, diarrhea, abdominal pain, blood in stool, hematemesis, jaundice and rectal bleeding. . ; ; Genitourinary: Negative for dysuria, flank pain and hematuria. ; ; Musculoskeletal: Negative for back pain and neck pain. Negative for  swelling and trauma.; ; Skin: Negative for pruritus, rash, abrasions, blisters, bruising and +skin lesion.; ; Neuro: Negative for headache, lightheadedness and neck stiffness. Negative for weakness, altered level of consciousness , altered mental status, extremity weakness, paresthesias, involuntary movement, seizure and syncope.       Allergies  Review of patient's allergies indicates no known allergies.  Home Medications   Current Outpatient Rx  Name Route Sig Dispense Refill  . ACETAMINOPHEN 500 MG PO TABS Oral Take 1,000 mg by mouth as needed. For pain    . OXYCODONE-ACETAMINOPHEN 5-325 MG PO TABS Oral Take 1 tablet by mouth every 4 (four) hours as needed for pain. 15 tablet 0  . SULFAMETHOXAZOLE-TRIMETHOPRIM 800-160 MG PO TABS Oral Take 1 tablet by mouth 2 (two) times daily. 28 tablet 0    LMP 03/05/2012  Physical Exam 0755: Physical examination:  Nursing notes reviewed; Vital signs and O2 SAT reviewed;  Constitutional: Well developed, Well nourished, Well hydrated, In no acute distress; Head:  Normocephalic, atraumatic; Eyes: EOMI, PERRL, No scleral icterus; ENMT: Mouth and pharynx normal, Mucous membranes moist; Neck: Supple, Full range of motion; Cardiovascular: Regular rate and rhythm, No gallop; Respiratory: Breath sounds clear & equal bilaterally, No wheezes.  Speaking full sentences with ease, Normal respiratory effort/excursion; Chest: Nontender, Movement normal; Abdomen: Soft, Nontender, Nondistended, Normal bowel sounds;; Extremities: Pulses normal, No tenderness, No edema, No calf edema or asymmetry.; Neuro: AA&Ox3, Major CN grossly intact.  Speech clear. No gross focal motor or  sensory deficits in extremities.; Skin: Color normal, Warm, Dry, +left side of abd wall pannus with approx 2cm diameter packed shallow wound without surrounding erythema, edema, ecchymosis, soft tissue crepitus or palp abscess.   ED Course  Procedures    MDM  MDM Reviewed: nursing note, previous  chart and vitals     8:01 AM:  Pt has been eval in ED x2 for same previously.  Site has significantly improved from her previous visits.  Packing removed, no drainage or bleeding from left sided abd wound with base of wound having visualized granulation tissue.  There is no surrounding erythema, no soft tissue crepitus, no further palp abscess.  Pt will continue her PO abx, warm soaks, cover with DSD.  Dx d/w pt.  Questions answered.  Verb understanding, agreeable to d/c home with outpt f/u.     I personally performed the services described in this documentation, which was scribed in my presence. The recorded information has been reviewed and considered. [name] [name], DO 05/03/12 1933

## 2012-05-03 NOTE — ED Notes (Signed)
Area redressed.

## 2012-05-07 ENCOUNTER — Encounter (HOSPITAL_COMMUNITY): Payer: Self-pay | Admitting: *Deleted

## 2012-05-07 ENCOUNTER — Emergency Department (HOSPITAL_COMMUNITY)
Admission: EM | Admit: 2012-05-07 | Discharge: 2012-05-07 | Disposition: A | Discharge status: Home / Self Care | Payer: Self-pay | Attending: Emergency Medicine | Admitting: Emergency Medicine

## 2012-05-07 DIAGNOSIS — F17200 Nicotine dependence, unspecified, uncomplicated: Secondary | ICD-10-CM | POA: Insufficient documentation

## 2012-05-07 DIAGNOSIS — L0291 Cutaneous abscess, unspecified: Secondary | ICD-10-CM

## 2012-05-07 DIAGNOSIS — Z4801 Encounter for change or removal of surgical wound dressing: Secondary | ICD-10-CM | POA: Insufficient documentation

## 2012-05-07 NOTE — ED Notes (Signed)
Pt discharged. Pt stable at time of discharge. Medications reviewed pt has no questions regarding discharge at this time. Pt voiced understanding of discharge instructions. Pt advised to continue antibiotics as directed.

## 2012-05-07 NOTE — ED Notes (Signed)
Pt returns to er for recheck of abscess to left lower abd area.  Pt denies any problems with area at home,

## 2012-05-07 NOTE — ED Provider Notes (Signed)
History     CSN: [id number]  Arrival date & time 05/07/12  4098   First MD Initiated Contact with Patient 05/07/12 0802      Chief Complaint  Patient presents with  . Wound Check    (Consider location/radiation/quality/duration/timing/severity/associated sxs/prior treatment) HPI Comments: Patient returns to ED requesting recheck of an abscess to her lower left abdomen and to have a dressing change.  States the pain has improved and the previous redness has cleared.  She denies fever, chills, vomiting, abdominal pain, or drainage.  Patient was seen here on 04/29/12 for abscess/cellulitis and had I&D preformed at that time.  Was given IV vancomycin and prescribed Bactrim which she has been taking.    Patient is a 51 y.o. female presenting with wound check. The history is provided by the patient.  Wound Check  She was treated in the ED 3 to 5 days ago. Previous treatment in the ED includes I&D of abscess. Treatments since wound repair include oral antibiotics and regular soap and water washings. Fever duration: no fever. There has been no drainage from the wound. The redness has improved. The swelling has improved. The pain has improved. She has no difficulty moving the affected extremity or digit.    Past Medical History  Diagnosis Date  . Alcohol abuse     No past surgical history on file.  No family history on file.  History  Substance Use Topics  . Smoking status: Current Everyday Smoker    Types: Cigarettes  . Smokeless tobacco: Not on file  . Alcohol Use: No    OB History    Grav Para Term Preterm Abortions TAB SAB Ect Mult Living                  Review of Systems  Constitutional: Negative for fever and chills.  Gastrointestinal: Negative for nausea and vomiting.  Musculoskeletal: Negative for joint swelling and arthralgias.  Skin: Positive for color change.       Abscess   Hematological: Negative for adenopathy.  All other systems reviewed and are  negative.    Allergies  Review of patient's allergies indicates no known allergies.  Home Medications   Current Outpatient Rx  Name Route Sig Dispense Refill  . ACETAMINOPHEN 500 MG PO TABS Oral Take 1,000 mg by mouth as needed. For pain    . OXYCODONE-ACETAMINOPHEN 5-325 MG PO TABS Oral Take 1 tablet by mouth every 4 (four) hours as needed for pain. 15 tablet 0  . SULFAMETHOXAZOLE-TRIMETHOPRIM 800-160 MG PO TABS Oral Take 1 tablet by mouth 2 (two) times daily. 28 tablet 0    BP 134/68  Pulse 66  Temp 98.1 F (36.7 C)  Resp 20  SpO2 100%  LMP 03/05/2012  Physical Exam  Nursing note and vitals reviewed. Constitutional: She is oriented to person, place, and time. She appears well-developed and well-nourished. No distress.  HENT:  Head: Normocephalic and atraumatic.  Cardiovascular: Normal rate, regular rhythm and normal heart sounds.   Pulmonary/Chest: Effort normal and breath sounds normal.  Abdominal: Soft. She exhibits no distension and no mass. There is no tenderness. There is no rebound and no guarding.  Musculoskeletal: Normal range of motion.  Neurological: She is alert and oriented to person, place, and time. She exhibits normal muscle tone. Coordination normal.  Skin: Skin is warm. There is erythema.          Open wound to the left lower abdominal wall .  Wound appears to be healing  well.  No induration, drainage or surrounding erythema.      ED Course  Procedures (including critical care time)  Labs Reviewed - No data to display      MDM  Previous ED charts and nursing notes reviewed by me.   Abscess to the left lower abd with previous I&D,  appears to be healing well.  No significant induration, no surrounding erythema.  Pt currently taking Bactrim DS.  I have advised her to continue the abx as directed, warm water soaks and to return here if needed.    The patient appears reasonably screened and/or stabilized for discharge and I doubt any other medical  condition or other [facility] requiring further screening, evaluation, or treatment in the ED at this time prior to discharge.       Sahana Boyland L. [place], [place] 05/07/12 [phone]

## 2012-05-17 NOTE — ED Provider Notes (Signed)
History     CSN: [id number]  Arrival date & time 05/07/12  1478   First MD Initiated Contact with Patient 05/07/12 0802      Chief Complaint  Patient presents with  . Wound Check    (Consider location/radiation/quality/duration/timing/severity/associated sxs/prior treatment) HPI  Past Medical History  Diagnosis Date  . Alcohol abuse     No past surgical history on file.  No family history on file.  History  Substance Use Topics  . Smoking status: Current Everyday Smoker    Types: Cigarettes  . Smokeless tobacco: Not on file  . Alcohol Use: No    OB History    Grav Para Term Preterm Abortions TAB SAB Ect Mult Living                  Review of Systems  Allergies  Review of patient's allergies indicates no known allergies.  Home Medications   Current Outpatient Rx  Name Route Sig Dispense Refill  . ACETAMINOPHEN 500 MG PO TABS Oral Take 1,000 mg by mouth as needed. For pain      BP 134/68  Pulse 66  Temp 98.1 F (36.7 C)  Resp 20  SpO2 100%  LMP 03/05/2012  Physical Exam  ED Course  Procedures (including critical care time)  Labs Reviewed - No data to display No results found.   1. Abscess       MDM  Medical screening examination/treatment/procedure(s) were performed by non-physician practitioner and as supervising physician I was immediately available for consultation/collaboration.         Donnetta Hutching, MD 05/17/12 [phone]

## 2012-05-18 NOTE — ED Provider Notes (Signed)
Medical screening examination/treatment/procedure(s) were performed by non-physician practitioner and as supervising physician I was immediately available for consultation/collaboration.  Donnetta Hutching, MD 05/18/12 [phone]

## 2012-08-10 ENCOUNTER — Encounter (HOSPITAL_COMMUNITY): Payer: Self-pay | Admitting: *Deleted

## 2012-08-10 ENCOUNTER — Emergency Department (HOSPITAL_COMMUNITY)
Admission: EM | Admit: 2012-08-10 | Discharge: 2012-08-10 | Disposition: A | Discharge status: Home / Self Care | Payer: Self-pay | Attending: Emergency Medicine | Admitting: Emergency Medicine

## 2012-08-10 DIAGNOSIS — Y929 Unspecified place or not applicable: Secondary | ICD-10-CM | POA: Insufficient documentation

## 2012-08-10 DIAGNOSIS — F17200 Nicotine dependence, unspecified, uncomplicated: Secondary | ICD-10-CM | POA: Insufficient documentation

## 2012-08-10 DIAGNOSIS — S81009A Unspecified open wound, unspecified knee, initial encounter: Secondary | ICD-10-CM | POA: Insufficient documentation

## 2012-08-10 DIAGNOSIS — Y939 Activity, unspecified: Secondary | ICD-10-CM | POA: Insufficient documentation

## 2012-08-10 DIAGNOSIS — W268XXA Contact with other sharp object(s), not elsewhere classified, initial encounter: Secondary | ICD-10-CM | POA: Insufficient documentation

## 2012-08-10 DIAGNOSIS — T148XXA Other injury of unspecified body region, initial encounter: Secondary | ICD-10-CM

## 2012-08-10 DIAGNOSIS — F1010 Alcohol abuse, uncomplicated: Secondary | ICD-10-CM | POA: Insufficient documentation

## 2012-08-10 MED ORDER — SULFAMETHOXAZOLE-TRIMETHOPRIM 800-160 MG PO TABS: 1.0000 | ORAL_TABLET | Freq: Two times a day (BID) | ORAL | Status: DC

## 2012-08-10 MED ORDER — OXYCODONE-ACETAMINOPHEN 5-325 MG PO TABS
1.0000 | ORAL_TABLET | Freq: Once | ORAL | Status: AC
  Administered 2012-08-10: 1 via ORAL
  Filled 2012-08-10: qty 1

## 2012-08-10 MED ORDER — OXYCODONE-ACETAMINOPHEN 5-325 MG PO TABS: 1.0000 | ORAL_TABLET | ORAL | Status: AC | PRN

## 2012-08-10 MED ORDER — SULFAMETHOXAZOLE-TMP DS 800-160 MG PO TABS
1.0000 | ORAL_TABLET | Freq: Once | ORAL | Status: AC
  Administered 2012-08-10: 1 via ORAL
  Filled 2012-08-10: qty 1

## 2012-08-10 NOTE — ED Notes (Signed)
I & D wound to abdomen to be rechecked. Pt states area keeps draining. Procedure was 2 mo. Ago. Wound to left leg x 4 days. Redness and swelling around area. Chronic lower back pain.

## 2012-08-10 NOTE — ED Provider Notes (Signed)
History     CSN: [id number]  Arrival date & time 08/10/12  7829   First MD Initiated Contact with Patient 08/10/12 [phone]      Chief Complaint  Patient presents with  . Wound Check    (Consider location/radiation/quality/duration/timing/severity/associated sxs/prior treatment) HPI Comments: Patient c/o pain, redness and swelling to a laceration of the left lower leg that occurred 4 days ago.  States she has been cleaning the wound with alcohol.  Pain to her leg with standing or movement, improves with rest.  She also c/o continued drainage from an abscess to her left abdomen that was I&D'd 2 months ago.  She denies fever, chills, numbness or weakness or her leg.    Patient is a 51 y.o. female presenting with skin laceration. The history is provided by the patient.  Laceration  The incident occurred more than 2 days ago. The laceration is located on the left leg. The laceration is 2 cm in size. The laceration mechanism was a a metal edge. The pain is moderate. The pain has been constant since onset. She reports no foreign bodies present. Her tetanus status is UTD.    Past Medical History  Diagnosis Date  . Alcohol abuse     History reviewed. No pertinent past surgical history.  No family history on file.  History  Substance Use Topics  . Smoking status: Current Every Day Smoker    Types: Cigarettes  . Smokeless tobacco: Not on file  . Alcohol Use: No    OB History    Grav Para Term Preterm Abortions TAB SAB Ect Mult Living                  Review of Systems  Constitutional: Negative for fever and chills.  Musculoskeletal: Negative for back pain, joint swelling and arthralgias.  Skin: Positive for color change and wound.       Laceration   Neurological: Negative for dizziness, weakness and numbness.  Hematological: Does not bruise/bleed easily.  All other systems reviewed and are negative.    Allergies  Review of patient's allergies indicates no known  allergies.  Home Medications   Current Outpatient Rx  Name Route Sig Dispense Refill  . ACETAMINOPHEN 500 MG PO TABS Oral Take 1,000 mg by mouth as needed. For pain      BP 126/76  Pulse 78  Temp 98.3 F (36.8 C) (Oral)  Resp 16  SpO2 100%  Physical Exam  Nursing note and vitals reviewed. Constitutional: She is oriented to person, place, and time. She appears well-developed and well-nourished. No distress.  HENT:  Head: Normocephalic and atraumatic.  Cardiovascular: Normal rate, regular rhythm and normal heart sounds.   Pulmonary/Chest: Effort normal and breath sounds normal.  Abdominal: Soft. She exhibits no distension. There is no tenderness. There is no rebound and no guarding.       Well healing incision to the left abdomen w/o erythema , induration, fluctuance, or drainage.    Musculoskeletal: She exhibits no edema and no tenderness.  Neurological: She is alert and oriented to person, place, and time. She exhibits normal muscle tone. Coordination normal.  Skin: Laceration noted. There is erythema.          DP pulse is brisk, distal sensation intact.  No drainage from the wound    ED Course  Procedures (including critical care time)  Labs Reviewed - No data to display No results found.      MDM    Previous ED charts  reviewed by me.  Pt is ambulatory w/o difficulty.  Non-toxic appearing.  Small, old laceration to the left lower leg with possible early onset cellulitis.    Pt agrees to warm soaks, elevation and I will start abx.  She agree to close f/u and to return here if the sx's worsen.    prescribed Percocet #15 Bactrim DS           Rhone Ozaki L. [place], [place] 08/11/12 2132

## 2012-08-15 NOTE — ED Provider Notes (Signed)
Medical screening examination/treatment/procedure(s) were performed by non-physician practitioner and as supervising physician I was immediately available for consultation/collaboration.   Shelda Jakes, MD 08/15/12 [phone]

## 2012-08-28 ENCOUNTER — Emergency Department (HOSPITAL_COMMUNITY)
Admission: EM | Admit: 2012-08-28 | Discharge: 2012-08-28 | Disposition: A | Discharge status: Home / Self Care | Payer: Self-pay | Attending: Emergency Medicine | Admitting: Emergency Medicine

## 2012-08-28 ENCOUNTER — Encounter (HOSPITAL_COMMUNITY): Payer: Self-pay

## 2012-08-28 DIAGNOSIS — M25562 Pain in left knee: Secondary | ICD-10-CM

## 2012-08-28 DIAGNOSIS — M25569 Pain in unspecified knee: Secondary | ICD-10-CM | POA: Insufficient documentation

## 2012-08-28 DIAGNOSIS — L989 Disorder of the skin and subcutaneous tissue, unspecified: Secondary | ICD-10-CM | POA: Insufficient documentation

## 2012-08-28 DIAGNOSIS — F17200 Nicotine dependence, unspecified, uncomplicated: Secondary | ICD-10-CM | POA: Insufficient documentation

## 2012-08-28 MED ORDER — TRAMADOL HCL 50 MG PO TABS: 50.0000 mg | ORAL_TABLET | Freq: Four times a day (QID) | ORAL | Status: DC | PRN

## 2012-08-28 MED ORDER — SULFAMETHOXAZOLE-TRIMETHOPRIM 800-160 MG PO TABS: 1.0000 | ORAL_TABLET | Freq: Two times a day (BID) | ORAL | Status: DC

## 2012-08-28 NOTE — ED Provider Notes (Signed)
Medical screening examination/treatment/procedure(s) were performed by non-physician practitioner and as supervising physician I was immediately available for consultation/collaboration. Devoria Albe, MD, Armando Gang   Ward Givens, MD 08/28/12 [phone]

## 2012-08-28 NOTE — ED Provider Notes (Signed)
Medical screening examination/treatment/procedure(s) were performed by non-physician practitioner and as supervising physician I was immediately available for consultation/collaboration. Devoria Albe, MD, FACEP   Ward Givens, MD 08/28/12 [phone]

## 2012-08-28 NOTE — ED Notes (Signed)
Pt c/o pain behind left knee and reports has boil starting on rlq.

## 2012-08-28 NOTE — ED Provider Notes (Addendum)
History     CSN: [id number]  Arrival date & time 08/28/12  0347   First MD Initiated Contact with Patient 08/28/12 0805      Chief Complaint  Patient presents with  . Abscess    (Consider location/radiation/quality/duration/timing/severity/associated sxs/prior treatment) HPI Comments: Patient c/o small abscess to her right lower abdomen.  Noticed pain and redness to the area yesterday.  Has hx of same.  She also c/o increased pain to her left knee.  Pt has hx chronic pain to her that knee.  States she was seeing Dr. Hilda Lias for her knee pain and he referred her to Dr. Richardson Dopp in [place] who evaluated her but she states he would not do surgery on her knee.  She denies fever, swelling of the knee, redness of the knee or hip pain.  Pain to the knee is reproduced with flexion and improves with rest.   Patient is a 51 y.o. female presenting with abscess. The history is provided by the patient.  Abscess  This is a new problem. The current episode started yesterday. The onset was sudden. The problem occurs continuously. The problem has been gradually worsening. The abscess is present on the abdomen. The problem is mild. The abscess is characterized by painfulness and redness. It is unknown what she was exposed to. Pertinent negatives include no decrease in physical activity, no fever, no vomiting and no decreased responsiveness. Her past medical history is significant for skin abscesses in family. There were no sick contacts.    Past Medical History  Diagnosis Date  . Alcohol abuse     History reviewed. No pertinent past surgical history.  No family history on file.  History  Substance Use Topics  . Smoking status: Current Every Day Smoker    Types: Cigarettes  . Smokeless tobacco: Not on file  . Alcohol Use: No    OB History    Grav Para Term Preterm Abortions TAB SAB Ect Mult Living                  Review of Systems  Constitutional: Negative for fever, chills and decreased  responsiveness.  Gastrointestinal: Negative for vomiting.  Genitourinary: Negative for dysuria and difficulty urinating.  Musculoskeletal: Positive for arthralgias.  Skin: Negative for color change and wound.       pustule to lower abdomen  All other systems reviewed and are negative.    Allergies  Review of patient's allergies indicates no known allergies.  Home Medications   Current Outpatient Rx  Name  Route  Sig  Dispense  Refill  . ACETAMINOPHEN 500 MG PO TABS   Oral   Take 1,000 mg by mouth every 6 (six) hours as needed. For pain         . SULFAMETHOXAZOLE-TRIMETHOPRIM 800-160 MG PO TABS   Oral   Take 1 tablet by mouth 2 (two) times daily.   20 tablet   0     BP 132/72  Pulse 67  Temp 98.3 F (36.8 C) (Oral)  Resp 18  Ht 5\' 4"  (1.626 m)  Wt 176 lb (79.833 kg)  BMI 30.21 kg/m2  SpO2 98%  LMP 08/21/2012  Physical Exam  Nursing note and vitals reviewed. Constitutional: She is oriented to person, place, and time. She appears well-developed and well-nourished. No distress.  HENT:  Head: Normocephalic and atraumatic.  Cardiovascular: Normal rate, regular rhythm, normal heart sounds and intact distal pulses.   No murmur heard. Pulmonary/Chest: Effort normal and breath sounds normal.  Musculoskeletal: She exhibits tenderness. She exhibits no edema.       Left knee: She exhibits normal range of motion, no swelling, no effusion, no ecchymosis, no erythema and no bony tenderness. tenderness found.       Legs:      ttp of the anterior left knee, mild crepitus with flexion.  No effusion, erythema, or excessive warmth of the joint  Neurological: She is alert and oriented to person, place, and time. No cranial nerve deficit or sensory deficit. She exhibits normal muscle tone. Coordination normal.  Skin: Skin is warm and dry. No erythema.    ED Course  Procedures (including critical care time)  Labs Reviewed - No data to display No results found.      MDM      Previous ed charts reviewed.  Pt has a small pustule to her abdomen that was cleaned and de-roofed by me with a 18 g needle.  Pt tolerated well.  Left knee pain is chronic.    Prescribed: Bactrim DS ultram     Tyrianna Lightle L. Zavian Slowey, PA 08/28/12 0841  Tyquon Near L. [place], [place] 08/28/12 [phone]

## 2012-09-03 ENCOUNTER — Encounter (HOSPITAL_COMMUNITY): Payer: Self-pay | Admitting: Emergency Medicine

## 2012-09-03 ENCOUNTER — Emergency Department (HOSPITAL_COMMUNITY)
Admission: EM | Admit: 2012-09-03 | Discharge: 2012-09-03 | Disposition: A | Discharge status: Home / Self Care | Payer: Self-pay | Attending: Emergency Medicine | Admitting: Emergency Medicine

## 2012-09-03 DIAGNOSIS — F17200 Nicotine dependence, unspecified, uncomplicated: Secondary | ICD-10-CM | POA: Insufficient documentation

## 2012-09-03 DIAGNOSIS — L02219 Cutaneous abscess of trunk, unspecified: Secondary | ICD-10-CM | POA: Insufficient documentation

## 2012-09-03 DIAGNOSIS — L0291 Cutaneous abscess, unspecified: Secondary | ICD-10-CM

## 2012-09-03 MED ORDER — HYDROMORPHONE HCL PF 1 MG/ML IJ SOLN
1.0000 mg | Freq: Once | INTRAMUSCULAR | Status: AC
  Administered 2012-09-03: 1 mg via INTRAMUSCULAR
  Filled 2012-09-03: qty 1

## 2012-09-03 MED ORDER — SULFAMETHOXAZOLE-TRIMETHOPRIM 800-160 MG PO TABS: 1.0000 | ORAL_TABLET | Freq: Two times a day (BID) | ORAL | Status: AC

## 2012-09-03 MED ORDER — LIDOCAINE HCL (PF) 1 % IJ SOLN
INTRAMUSCULAR | Status: AC
  Administered 2012-09-03: 09:00:00
  Filled 2012-09-03: qty 5

## 2012-09-03 NOTE — ED Notes (Signed)
Pt c/o abscess to left lower abd.

## 2012-09-03 NOTE — ED Provider Notes (Signed)
History   This chart was scribed for Rebecca Lennert, MD by Charolett Bumpers, ER Scribe. The patient was seen in room APA17/[facility]17. Patient's care was started at 0741.   CSN: [id number]  Arrival date & time 09/03/12  4098   First MD Initiated Contact with Patient 09/03/12 [phone]      Chief Complaint  Patient presents with  . Abscess   BERKLIE DETHLEFS is a 51 y.o. female who presents to the Emergency Department complaining of an abscess to her LLQ that started yesterday. She states her symptoms are worsening. She reports she has a h/o abscesses in which they have been lanced in the past.  Patient is a 51 y.o. female presenting with abscess. The history is provided by the patient. No language interpreter was used.  Abscess  This is a recurrent problem. The current episode started yesterday. The onset was gradual. The problem has been gradually worsening. The abscess is present on the abdomen. Pertinent negatives include no diarrhea, no congestion and no cough.    Past Medical History  Diagnosis Date  . Alcohol abuse     History reviewed. No pertinent past surgical history.  No family history on file.  History  Substance Use Topics  . Smoking status: Current Every Day Smoker    Types: Cigarettes  . Smokeless tobacco: Not on file  . Alcohol Use: No    OB History    Grav Para Term Preterm Abortions TAB SAB Ect Mult Living                  Review of Systems  Constitutional: Negative for fatigue.  HENT: Negative for congestion, sinus pressure and ear discharge.   Eyes: Negative for discharge.  Respiratory: Negative for cough.   Cardiovascular: Negative for chest pain.  Gastrointestinal: Negative for abdominal pain and diarrhea.  Genitourinary: Negative for frequency and hematuria.  Musculoskeletal: Negative for back pain.  Skin: Positive for wound. Negative for rash.       Abscess  Neurological: Negative for seizures and headaches.  Hematological: Negative.     Psychiatric/Behavioral: Negative for hallucinations.  All other systems reviewed and are negative.    Allergies  Review of patient's allergies indicates no known allergies.  Home Medications   Current Outpatient Rx  Name  Route  Sig  Dispense  Refill  . ACETAMINOPHEN 500 MG PO TABS   Oral   Take 1,000 mg by mouth every 6 (six) hours as needed. For pain         . SULFAMETHOXAZOLE-TRIMETHOPRIM 800-160 MG PO TABS   Oral   Take 1 tablet by mouth 2 (two) times daily.   20 tablet   0   . SULFAMETHOXAZOLE-TRIMETHOPRIM 800-160 MG PO TABS   Oral   Take 1 tablet by mouth 2 (two) times daily.   20 tablet   0   . TRAMADOL HCL 50 MG PO TABS   Oral   Take 1 tablet (50 mg total) by mouth every 6 (six) hours as needed for pain.   15 tablet   0     BP 147/103  Pulse 65  Temp 98.2 F (36.8 C)  Resp 20  Ht 5\' 4"  (1.626 m)  Wt 176 lb (79.833 kg)  BMI 30.21 kg/m2  SpO2 100%  LMP 08/21/2012  Physical Exam  Constitutional: She is oriented to person, place, and time. She appears well-developed.  HENT:  Head: Normocephalic.  Eyes: Conjunctivae normal are normal.  Neck: No tracheal deviation  present.  Cardiovascular:  No murmur heard. Abdominal:       In LLQ, an abscess that is 2 cm by 1cm.   Musculoskeletal: Normal range of motion.  Neurological: She is oriented to person, place, and time.  Skin: Skin is warm.  Psychiatric: She has a normal mood and affect.    ED Course  INCISION AND DRAINAGE Performed by: Woodruff Skirvin L Authorized by: Bethann Berkshire L Comments: Abscess to abd.  I and D with # 11 bladed,  Pus removed without problems   (including critical care time)  DIAGNOSTIC STUDIES: Oxygen Saturation is 100% on room air, normal by my interpretation.    COORDINATION OF CARE:  08:10-Discussed planned course of treatment with the patient including pain medication and I&D of LLQ abscess, who is agreeable at this time.   08:15-Medication Orders: Hydromorphone  (Dilaudid) injection 1 mg-once  Labs Reviewed - No data to display No results found.   No diagnosis found.    MDM      The chart was scribed for me under my direct supervision.  I personally performed the history, physical, and medical decision making and all procedures in the evaluation of this patient.Rebecca Lennert, MD 09/03/12 [phone]

## 2013-05-12 ENCOUNTER — Encounter (HOSPITAL_COMMUNITY): Payer: Self-pay | Admitting: Emergency Medicine

## 2013-05-12 ENCOUNTER — Emergency Department (HOSPITAL_COMMUNITY)
Admission: EM | Admit: 2013-05-12 | Discharge: 2013-05-12 | Disposition: A | Discharge status: Home / Self Care | Payer: Self-pay | Attending: Emergency Medicine | Admitting: Emergency Medicine

## 2013-05-12 DIAGNOSIS — IMO0001 Reserved for inherently not codable concepts without codable children: Secondary | ICD-10-CM | POA: Insufficient documentation

## 2013-05-12 DIAGNOSIS — M549 Dorsalgia, unspecified: Secondary | ICD-10-CM

## 2013-05-12 DIAGNOSIS — M79609 Pain in unspecified limb: Secondary | ICD-10-CM | POA: Insufficient documentation

## 2013-05-12 DIAGNOSIS — M545 Low back pain, unspecified: Secondary | ICD-10-CM | POA: Insufficient documentation

## 2013-05-12 DIAGNOSIS — Y939 Activity, unspecified: Secondary | ICD-10-CM | POA: Insufficient documentation

## 2013-05-12 DIAGNOSIS — Y929 Unspecified place or not applicable: Secondary | ICD-10-CM | POA: Insufficient documentation

## 2013-05-12 DIAGNOSIS — S50861A Insect bite (nonvenomous) of right forearm, initial encounter: Secondary | ICD-10-CM

## 2013-05-12 DIAGNOSIS — F17200 Nicotine dependence, unspecified, uncomplicated: Secondary | ICD-10-CM | POA: Insufficient documentation

## 2013-05-12 MED ORDER — SULFAMETHOXAZOLE-TRIMETHOPRIM 800-160 MG PO TABS: 1.0000 | ORAL_TABLET | Freq: Two times a day (BID) | ORAL | Status: DC

## 2013-05-12 MED ORDER — ACETAMINOPHEN-CODEINE #3 300-30 MG PO TABS: 1.0000 | ORAL_TABLET | ORAL | Status: DC | PRN

## 2013-05-12 MED ORDER — PREDNISONE 10 MG PO TABS: ORAL_TABLET | ORAL | Status: DC

## 2013-05-12 NOTE — ED Notes (Signed)
Pt c/o lower back pain and insect bite to right arm x 3 days.

## 2013-05-12 NOTE — ED Provider Notes (Signed)
Medical screening examination/treatment/procedure(s) were performed by non-physician practitioner and as supervising physician I was immediately available for consultation/collaboration.   Hurman Horn, MD 05/12/13 2010

## 2013-05-12 NOTE — ED Provider Notes (Signed)
CSN: [id number]     Arrival date & time 05/12/13  1017 History     First MD Initiated Contact with Patient 05/12/13 1146     Chief Complaint  Patient presents with  . Back Pain  . Insect Bite   (Consider location/radiation/quality/duration/timing/severity/associated sxs/prior Treatment) HPI Comments: Patient is a 52 year old female who presents to the emergency department with 3 days of" insect bite" to the right forearm. Patient states that she noticed increasing pain in this area. She has also noticed increased redness, and no swelling going up beyond the elbow area. The patient has not had any chills or known fever. She has a tightness of the forearm, and tightness with flexing and extending the fingers of the right hand.  Patient also complains of low back pain, left more than right. This is not a new finding, but something that she has had problems with in the past that seems to be getting worse. The patient has not lost any power or function or sensation in the lower extremities. She's not having frequent falls.  The history is provided by the patient.    Past Medical History  Diagnosis Date  . Alcohol abuse    History reviewed. No pertinent past surgical history. No family history on file. History  Substance Use Topics  . Smoking status: Current Every Day Smoker -- 0.50 packs/day    Types: Cigarettes  . Smokeless tobacco: Not on file  . Alcohol Use: No   OB History   Grav Para Term Preterm Abortions TAB SAB Ect Mult Living                 Review of Systems  Constitutional: Negative for activity change.       All ROS Neg except as noted in HPI  HENT: Negative for nosebleeds and neck pain.   Eyes: Negative for photophobia and discharge.  Respiratory: Negative for cough, shortness of breath and wheezing.   Cardiovascular: Negative for chest pain and palpitations.  Gastrointestinal: Negative for abdominal pain and blood in stool.  Genitourinary: Negative for dysuria,  frequency and hematuria.  Musculoskeletal: Positive for back pain. Negative for arthralgias.  Skin: Negative.   Neurological: Negative for dizziness, seizures and speech difficulty.  Psychiatric/Behavioral: Negative for hallucinations and confusion.    Allergies  Review of patient's allergies indicates no known allergies.  Home Medications  No current outpatient prescriptions on file. BP 155/68  Pulse 63  Temp(Src) 98.8 F (37.1 C)  Ht 5\' 4"  (1.626 m)  Wt 183 lb (83.008 kg)  BMI 31.4 kg/m2  SpO2 100%  LMP 05/03/2013 Physical Exam  Nursing note and vitals reviewed. Constitutional: She is oriented to person, place, and time. She appears well-developed and well-nourished.  Non-toxic appearance.  HENT:  Head: Normocephalic.  Right Ear: Tympanic membrane and external ear normal.  Left Ear: Tympanic membrane and external ear normal.  Eyes: EOM and lids are normal. Pupils are equal, round, and reactive to light.  Neck: Normal range of motion. Neck supple. Carotid bruit is not present.  Cardiovascular: Normal rate, regular rhythm, normal heart sounds, intact distal pulses and normal pulses.   Pulmonary/Chest: Breath sounds normal. No respiratory distress.  Abdominal: Soft. Bowel sounds are normal. There is no tenderness. There is no guarding.  Musculoskeletal: Normal range of motion.  There is increased redness and swelling of the ulnar aspect of the right forearm. There is redness extending up to just beyond the elbow. There are a few blisters at the mid forearm  area. Using magnification, I do not see evidence of a stinger or any other body parts left in the bite area. There is good range of motion of the fingers. Capillary refill is less than 3 seconds. There is pain of the lower lumbar area with palpation and with change of position. There is left paraspinal tenderness to palpation. There is no palpable step off.  Lymphadenopathy:       Head (right side): No submandibular adenopathy  present.       Head (left side): No submandibular adenopathy present.    She has no cervical adenopathy.  Neurological: She is alert and oriented to person, place, and time. She has normal strength. No cranial nerve deficit or sensory deficit.  Skin: Skin is warm and dry.  Psychiatric: She has a normal mood and affect. Her speech is normal.    ED Course   Procedures (including critical care time)  Labs Reviewed - No data to display No results found. No diagnosis found.  MDM  *I have reviewed nursing notes, vital signs, and all appropriate lab and imaging results for this patient.** Patient was bitten by an unknown insect approximately 3-4 days ago. She has increased redness and swelling that seems to be expanding. The plan at this time is for the patient to receive prednisone taper, Bactrim 2 times daily, and Tylenol with codeine for soreness. Patient is to follow with her primary physician for additional evaluation and management, Or return to the emergency department if any emergent changes.  Kathie Dike, PA-C 05/12/13 1231

## 2013-09-30 ENCOUNTER — Emergency Department (HOSPITAL_COMMUNITY)
Admission: EM | Admit: 2013-09-30 | Discharge: 2013-09-30 | Disposition: A | Discharge status: Home / Self Care | Payer: Self-pay | Attending: Emergency Medicine | Admitting: Emergency Medicine

## 2013-09-30 ENCOUNTER — Emergency Department (HOSPITAL_COMMUNITY): Payer: Self-pay

## 2013-09-30 ENCOUNTER — Encounter (HOSPITAL_COMMUNITY): Payer: Self-pay | Admitting: Emergency Medicine

## 2013-09-30 DIAGNOSIS — Y929 Unspecified place or not applicable: Secondary | ICD-10-CM | POA: Insufficient documentation

## 2013-09-30 DIAGNOSIS — S239XXA Sprain of unspecified parts of thorax, initial encounter: Secondary | ICD-10-CM | POA: Insufficient documentation

## 2013-09-30 DIAGNOSIS — S29019A Strain of muscle and tendon of unspecified wall of thorax, initial encounter: Secondary | ICD-10-CM

## 2013-09-30 DIAGNOSIS — R21 Rash and other nonspecific skin eruption: Secondary | ICD-10-CM

## 2013-09-30 DIAGNOSIS — X58XXXA Exposure to other specified factors, initial encounter: Secondary | ICD-10-CM | POA: Insufficient documentation

## 2013-09-30 DIAGNOSIS — Z79899 Other long term (current) drug therapy: Secondary | ICD-10-CM | POA: Insufficient documentation

## 2013-09-30 DIAGNOSIS — F17200 Nicotine dependence, unspecified, uncomplicated: Secondary | ICD-10-CM | POA: Insufficient documentation

## 2013-09-30 DIAGNOSIS — Y939 Activity, unspecified: Secondary | ICD-10-CM | POA: Insufficient documentation

## 2013-09-30 LAB — CBC WITH DIFFERENTIAL/PLATELET
Eosinophils Absolute: 0.2 10*3/uL (ref 0.0–0.7)
Eosinophils Relative: 3 % (ref 0–5)
HCT: 40.2 % (ref 36.0–46.0)
Hemoglobin: 13.4 g/dL (ref 12.0–15.0)
Lymphs Abs: 2.3 10*3/uL (ref 0.7–4.0)
MCH: 32.6 pg (ref 26.0–34.0)
MCV: 97.8 fL (ref 78.0–100.0)
Monocytes Absolute: 0.5 10*3/uL (ref 0.1–1.0)
Monocytes Relative: 8 % (ref 3–12)
Platelets: 285 10*3/uL (ref 150–400)
RBC: 4.11 MIL/uL (ref 3.87–5.11)
WBC: 5.7 10*3/uL (ref 4.0–10.5)

## 2013-09-30 LAB — BASIC METABOLIC PANEL
BUN: 10 mg/dL (ref 6–23)
Calcium: 8.8 mg/dL (ref 8.4–10.5)
Creatinine, Ser: 0.91 mg/dL (ref 0.50–1.10)
GFR calc non Af Amer: 71 mL/min — ABNORMAL LOW (ref >90)
Glucose, Bld: 100 mg/dL — ABNORMAL HIGH (ref 70–99)

## 2013-09-30 MED ORDER — OXYCODONE-ACETAMINOPHEN 5-325 MG PO TABS
1.0000 | ORAL_TABLET | Freq: Once | ORAL | Status: AC
  Administered 2013-09-30: 1 via ORAL
  Filled 2013-09-30: qty 1

## 2013-09-30 MED ORDER — SODIUM CHLORIDE 0.9 % IJ SOLN
INTRAMUSCULAR | Status: AC
  Filled 2013-09-30: qty 800

## 2013-09-30 MED ORDER — HYDROCODONE-ACETAMINOPHEN 5-325 MG PO TABS: 1.0000 | ORAL_TABLET | Freq: Four times a day (QID) | ORAL | Status: DC | PRN

## 2013-09-30 MED ORDER — PREDNISONE 50 MG PO TABS
60.0000 mg | ORAL_TABLET | Freq: Once | ORAL | Status: AC
  Administered 2013-09-30: 08:00:00 60 mg via ORAL
  Filled 2013-09-30 (×2): qty 1

## 2013-09-30 MED ORDER — DIPHENHYDRAMINE HCL 25 MG PO CAPS
25.0000 mg | ORAL_CAPSULE | Freq: Once | ORAL | Status: AC
  Administered 2013-09-30: 25 mg via ORAL
  Filled 2013-09-30: qty 1

## 2013-09-30 MED ORDER — IOHEXOL 350 MG/ML SOLN
100.0000 mL | Freq: Once | INTRAVENOUS | Status: AC | PRN
  Administered 2013-09-30: 100 mL via INTRAVENOUS

## 2013-09-30 MED ORDER — PREDNISONE 10 MG PO TABS: 20.0000 mg | ORAL_TABLET | Freq: Every day | ORAL | Status: DC

## 2013-09-30 NOTE — ED Provider Notes (Addendum)
CSN: [id number]     Arrival date & time 09/30/13  7829 History  This chart was scribed for Gwyneth Sprout, MD by Leone Payor, ED Scribe. This patient was seen in room APA19/[facility]19 and the patient's care was started 7:14 AM.     Chief Complaint  Patient presents with  . Rash  . Back Pain    The history is provided by the patient. No language interpreter was used.    HPI Comments: Rebecca Hurst is a 52 y.o. female who presents to the Emergency Department complaining of 1 week of constant, unchanged mid back pain that occasionally radiates to the right abdomen. She denies recent heavy lifting or strenuous activity. She states the back pain is worse with deep breathing. She denies SOB or cough.   Pt also complains of a 1 week of gradual onset, gradually worsening, constant rash to all four extremities, abdomen, buttocks. She reports mild itching as well. She states the rash began to her buttocks. She does use new soaps and detergents but no new medication use. She denies sick contacts within the home with similar symptoms. She denies trying OTC medications for these symptoms. No one else in her home with sx.  Past Medical History  Diagnosis Date  . Alcohol abuse    History reviewed. No pertinent past surgical history. No family history on file. History  Substance Use Topics  . Smoking status: Current Every Day Smoker -- 0.50 packs/day    Types: Cigarettes  . Smokeless tobacco: Not on file  . Alcohol Use: No   OB History   Grav Para Term Preterm Abortions TAB SAB Ect Mult Living                 Review of Systems A complete 10 system review of systems was obtained and all systems are negative except as noted in the HPI and PMH.   Allergies  Review of patient's allergies indicates no known allergies.  Home Medications   Current Outpatient Rx  Name  Route  Sig  Dispense  Refill  . acetaminophen-codeine (TYLENOL #3) 300-30 MG per tablet   Oral   Take 1 tablet by mouth every 4  (four) hours as needed for pain.   15 tablet   0   . predniSONE (DELTASONE) 10 MG tablet      5,4,3,2,1 - take with food   15 tablet   0   . sulfamethoxazole-trimethoprim (SEPTRA DS) 800-160 MG per tablet   Oral   Take 1 tablet by mouth 2 (two) times daily.   14 tablet   0    BP 139/79  Pulse 64  Temp(Src) 98.4 F (36.9 C)  Resp 18  Ht 5\' 4"  (1.626 m)  Wt 180 lb (81.647 kg)  BMI 30.88 kg/m2  SpO2 96% Physical Exam  Nursing note and vitals reviewed. Constitutional: She is oriented to person, place, and time. She appears well-developed and well-nourished.  HENT:  Head: Normocephalic and atraumatic.  Cardiovascular: Normal rate, regular rhythm, normal heart sounds and intact distal pulses.   No murmur heard. Pulmonary/Chest: Effort normal and breath sounds normal. No respiratory distress. She has no wheezes. She has no rales.  Abdominal: She exhibits no distension.  Musculoskeletal: She exhibits tenderness. She exhibits no edema.       Thoracic back: Tenderness: to palpation.       Back:  Neurological: She is alert and oriented to person, place, and time.  Skin: Skin is warm and dry. Rash noted.  Rash is maculopapular. Rash is not pustular and not vesicular.  Diffuse patches of excoriated maculopapular rash, more conccentrated over the buttock but involves the extremities and trunk. Blanches. No pustules or vesicles.   Psychiatric: She has a normal mood and affect.    ED Course  Procedures   DIAGNOSTIC STUDIES: Oxygen Saturation is 96% on RA, adequate by my interpretation.    COORDINATION OF CARE: 7:30 AM Will order CXR. Discussed treatment plan with pt at bedside and pt agreed to plan.   Labs Review Labs Reviewed  BASIC METABOLIC PANEL - Abnormal; Notable for the following:    Glucose, Bld 100 (*)    GFR calc non Af Amer 71 (*)    GFR calc Af Amer 83 (*)    All other components within normal limits  CBC WITH DIFFERENTIAL   Imaging Review Dg Chest 2  View  09/30/2013   CLINICAL DATA:  Pain in the right side of the chest for the past week.  EXAM: CHEST  2 VIEW  COMPARISON:  Chest x-ray 05/23/2011.  FINDINGS: New opacity obscuring the medial right hemidiaphragm, corresponds to increased density projecting over the anterior aspect of the right lower lobe noted on the lateral projection, concerning for right lower lobe pneumonia. Lungs are otherwise clear. There appears to be a small amount of pleural thickening and/or fluid, predominantly laterally within the lower right hemithorax. This is not well appreciated on the lateral projection. No evidence of pulmonary edema. Heart size is normal. Mediastinal contours are unremarkable.  IMPRESSION: 1. Findings, as above, most concerning for potential right lower lobe pneumonia, likely with a small amount of reactive pleural thickening and/or trace pleural fluid. Repeat radiographs in 2-3 weeks after trial of antimicrobial therapy is recommended to ensure resolution of these findings.   Electronically Signed   By: Trudie Reed M.D.   On: 09/30/2013 08:14   Ct Angio Chest Pe W/cm &/or Wo Cm  09/30/2013   CLINICAL DATA:  Right Mid back pain.  EXAM: CT ANGIOGRAPHY CHEST WITH CONTRAST  TECHNIQUE: Multidetector CT imaging of the chest was performed using the standard protocol during bolus administration of intravenous contrast. Multiplanar CT image reconstructions including MIPs were obtained to evaluate the vascular anatomy.  CONTRAST:  OMNIPAQUE IOHEXOL 350 MG/ML SOLN  COMPARISON:  05/23/2011  FINDINGS: No filling defects in the pulmonary arteries to suggest pulmonary emboli. Heart is normal size. Aorta is normal caliber. No evidence of dissection. No visible coronary artery calcifications. No mediastinal, hilar, or axillary adenopathy.  Linear atelectasis or scarring in the right lung base. Left lung is clear. No pleural or pericardial effusion.  Cystic areas within the retroareolar area of the breast  bilaterally, the largest on the left measuring 2.6 cm. This was present on prior study and is stable.  Imaging into the upper abdomen shows no acute findings.  No acute bony abnormality.  Review of the MIP images confirms the above findings.  IMPRESSION: No evidence of pulmonary embolus.  No acute findings.  Cystic masses within the retroareolar areas of the breast 's bilaterally, stable since prior study. This could be correlated with mammogram.   Electronically Signed   By: Charlett Nose M.D.   On: 09/30/2013 09:43    EKG Interpretation   None       MDM   1. Thoracic myofascial strain, initial encounter   2. Rash     Patient here with 2 separate issues. Initially with the complaint of right-sided back pain that  radiates around to the front of her chest with deep breathing and certain movements. Started approximately one week ago. She denies any trauma, heavy lifting or anything that she can recall as she did prior to the pain starting. She denies cough or shortness of breath.  She is well-appearing she has a normal heart rate and no risk factors for DVT or PE except for age.  No recent surgeries, immobilization or prior hx of DVT.  CXR pending.  Pt has presented here in the past with similar complaints with CTA at one point 2 years ago which was neg for PE.  8:19 AM CXR with findings concerning for PNA however pt denies fever, cough or other infectious sx.  Will get CTA of chest to r/o PE.  Secondly pt is here for a rash that has been ongoing for about 1 week that is pruritis but no signs of infection, shingles and low suspicion for scabies.  Pt does change detergents/soaps and shampoos often and most likely that is the cause.  Will treat with benadryl and prednisone.  9:51 AM CT neg for acute findings other than breast cyst which is most likely what was seen on CXR.  Will d/c home with medication for rash and pain meds for muscular strain.  I personally performed the services described in  this documentation, which was scribed in my presence.  The recorded information has been reviewed and considered.   Gwyneth Sprout, MD 09/30/13 1610  Gwyneth Sprout, MD 09/30/13 [phone]

## 2013-09-30 NOTE — ED Notes (Signed)
Pt c/o generalized itching rash to stomach and back and pain in right mid back. Pt states pain is worse with deep breath. Denies cough. Pt also c/o rash to left foot.

## 2013-10-21 ENCOUNTER — Emergency Department (HOSPITAL_COMMUNITY): Payer: Self-pay

## 2013-10-21 ENCOUNTER — Emergency Department (HOSPITAL_COMMUNITY)
Admission: EM | Admit: 2013-10-21 | Discharge: 2013-10-21 | Disposition: A | Discharge status: Home / Self Care | Payer: Self-pay | Attending: Emergency Medicine | Admitting: Emergency Medicine

## 2013-10-21 ENCOUNTER — Encounter (HOSPITAL_COMMUNITY): Payer: Self-pay | Admitting: Emergency Medicine

## 2013-10-21 DIAGNOSIS — F17200 Nicotine dependence, unspecified, uncomplicated: Secondary | ICD-10-CM | POA: Insufficient documentation

## 2013-10-21 DIAGNOSIS — M47816 Spondylosis without myelopathy or radiculopathy, lumbar region: Secondary | ICD-10-CM

## 2013-10-21 DIAGNOSIS — L259 Unspecified contact dermatitis, unspecified cause: Secondary | ICD-10-CM | POA: Insufficient documentation

## 2013-10-21 DIAGNOSIS — R109 Unspecified abdominal pain: Secondary | ICD-10-CM | POA: Insufficient documentation

## 2013-10-21 DIAGNOSIS — L309 Dermatitis, unspecified: Secondary | ICD-10-CM

## 2013-10-21 DIAGNOSIS — M47817 Spondylosis without myelopathy or radiculopathy, lumbosacral region: Secondary | ICD-10-CM | POA: Insufficient documentation

## 2013-10-21 LAB — URINE MICROSCOPIC-ADD ON

## 2013-10-21 LAB — URINALYSIS, ROUTINE W REFLEX MICROSCOPIC
Bilirubin Urine: NEGATIVE
GLUCOSE, UA: NEGATIVE mg/dL
Ketones, ur: NEGATIVE mg/dL
Nitrite: NEGATIVE
PH: 5.5 (ref 5.0–8.0)
Protein, ur: NEGATIVE mg/dL
Urobilinogen, UA: 0.2 mg/dL (ref 0.0–1.0)

## 2013-10-21 MED ORDER — HYDROCODONE-ACETAMINOPHEN 5-325 MG PO TABS
1.0000 | ORAL_TABLET | Freq: Once | ORAL | Status: AC
  Administered 2013-10-21: 1 via ORAL
  Filled 2013-10-21: qty 1

## 2013-10-21 MED ORDER — METHOCARBAMOL 500 MG PO TABS
1000.0000 mg | ORAL_TABLET | Freq: Once | ORAL | Status: AC
  Administered 2013-10-21: 1000 mg via ORAL
  Filled 2013-10-21: qty 2

## 2013-10-21 MED ORDER — HYDROCODONE-ACETAMINOPHEN 5-325 MG PO TABS: 1.0000 | ORAL_TABLET | ORAL | Status: DC | PRN

## 2013-10-21 MED ORDER — TRIAMCINOLONE ACETONIDE 0.1 % EX CREA: 1.0000 "application " | TOPICAL_CREAM | Freq: Two times a day (BID) | CUTANEOUS | Status: AC

## 2013-10-21 MED ORDER — PREDNISONE 10 MG PO TABS: ORAL_TABLET | ORAL | Status: AC

## 2013-10-21 MED ORDER — PREDNISONE 50 MG PO TABS
60.0000 mg | ORAL_TABLET | Freq: Once | ORAL | Status: AC
  Administered 2013-10-21: 60 mg via ORAL
  Filled 2013-10-21 (×2): qty 1

## 2013-10-21 MED ORDER — BACLOFEN 10 MG PO TABS: 10.0000 mg | ORAL_TABLET | Freq: Three times a day (TID) | ORAL | Status: AC

## 2013-10-21 NOTE — Progress Notes (Signed)
  ED/CM noted patient did not have health insurance and/or PCP listed in the computer.  [facility] with information on the clinics, food pantries, and the handout for new health insurance sign-up. Also, provided community drug discount card.  Informed patient to apply for adult medicaid at the [facility].  Patient expressed appreciation for information received.

## 2013-10-21 NOTE — ED Provider Notes (Signed)
CSN: [id number]     Arrival date & time 10/21/13  7829 History   First MD Initiated Contact with Patient 10/21/13 0932     Chief Complaint  Patient presents with  . Flank Pain  . Rash   (Consider location/radiation/quality/duration/timing/severity/associated sxs/prior Treatment) HPI Comments: Is a 53 year old female who presents to the emergency department with complaint of pain from the right flank to the mid to lower back. Patient also complains of a rash that is going on for more than a week.  The patient states that the flank pain has been going on for nearly a month. The pain is worse with certain movements and also deep breathing. The patient was seen in the emergency department for this previously at which time she had chest x-ray and CT angiogram of the workup. These were all negative. The patient has not had any recent fall or injury. There's been no fever. There's been no complaint of dysuria. There's been no nausea or vomiting reported. Patient is not had any previous operations or procedures involving this area.  The patient states that she also had a rash the last time she was seen in the emergency department, she was given medication, and this will weigh for while but came back. The patient now complains of one week of rash on her abdomen, buttocks, arms and legs. She has itching. She's not had any fever related to the rash. She denies any recent changes in medication or diet. She states she does change soaps from time to time, but is not aware of any soaps or detergents that she is allergic to.  Patient is a 53 y.o. female presenting with flank pain and rash. The history is provided by the patient.  Flank Pain Associated symptoms include a rash. Pertinent negatives include no abdominal pain, arthralgias, chest pain, coughing or neck pain.  Rash Associated symptoms: no abdominal pain, no joint pain, no shortness of breath and not wheezing     Past Medical History  Diagnosis Date   . Alcohol abuse    History reviewed. No pertinent past surgical history. No family history on file. History  Substance Use Topics  . Smoking status: Current Every Day Smoker -- 0.50 packs/day    Types: Cigarettes  . Smokeless tobacco: Not on file  . Alcohol Use: No   OB History   Grav Para Term Preterm Abortions TAB SAB Ect Mult Living                 Review of Systems  Constitutional: Negative for activity change.       All ROS Neg except as noted in HPI  HENT: Negative for nosebleeds.   Eyes: Negative for photophobia and discharge.  Respiratory: Negative for cough, shortness of breath and wheezing.   Cardiovascular: Negative for chest pain and palpitations.  Gastrointestinal: Negative for abdominal pain and blood in stool.  Genitourinary: Positive for flank pain. Negative for dysuria, frequency and hematuria.  Musculoskeletal: Negative for arthralgias, back pain and neck pain.  Skin: Positive for rash.  Neurological: Negative for dizziness, seizures and speech difficulty.  Psychiatric/Behavioral: Negative for hallucinations and confusion.    Allergies  Review of patient's allergies indicates no known allergies.  Home Medications  No current outpatient prescriptions on file. BP 127/67  Pulse 67  Temp(Src) 98 F (36.7 C) (Oral)  Resp 12  SpO2 100%  LMP 07/30/2013 Physical Exam  Nursing note and vitals reviewed. Constitutional: She is oriented to person, place, and time. She appears  well-developed and well-nourished.  Non-toxic appearance.  HENT:  Head: Normocephalic.  Right Ear: Tympanic membrane and external ear normal.  Left Ear: Tympanic membrane and external ear normal.  Eyes: EOM and lids are normal. Pupils are equal, round, and reactive to light.  Neck: Normal range of motion. Neck supple. Carotid bruit is not present.  Cardiovascular: Normal rate, regular rhythm, normal heart sounds, intact distal pulses and normal pulses.   Pulmonary/Chest: Breath sounds  normal. No respiratory distress.  No rib deformity appreciated posteriorly on the right. No abscess appreciated. No unusual rash of that region.  Abdominal: Soft. Bowel sounds are normal. There is no tenderness. There is no guarding.  Musculoskeletal: Normal range of motion.  There is right paraspinal tenderness in the lumbar region. There is no palpable step off of the lumbar area. There no hot areas appreciated.  Lymphadenopathy:       Head (right side): No submandibular adenopathy present.       Head (left side): No submandibular adenopathy present.    She has no cervical adenopathy.  Neurological: She is alert and oriented to person, place, and time. She has normal strength. No cranial nerve deficit or sensory deficit.  Skin: Skin is warm and dry.  Red macular papular rash noted of the abdomen, both lower extremities, both buttocks, and extending into both eyes. Chaperone present during examination.  Psychiatric: She has a normal mood and affect. Her speech is normal.    ED Course  Procedures (including critical care time) Labs Review Labs Reviewed  URINALYSIS, ROUTINE W REFLEX MICROSCOPIC   Imaging Review Dg Lumbar Spine Complete  10/21/2013   CLINICAL DATA:  Back/flank/right rib pain, no known injury  EXAM: LUMBAR SPINE - COMPLETE 4+ VIEW  COMPARISON:  None.  FINDINGS: Five lumbar type vertebral bodies.  Normal lumbar lordosis.  No evidence of fracture dislocation. Vertebral body heights and intervertebral disc spaces are maintained.  Mild degenerative changes at L3-4 and L4-5.  Visualized bony pelvis appears intact.  IMPRESSION: No fracture or dislocation is seen.  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   Electronically Signed   By: Charline BillsSriyesh  Krishnan M.D.   On: 10/21/2013 11:29    EKG Interpretation   None       MDM  No diagnosis found. *I have reviewed nursing notes, vital signs, and all appropriate lab and imaging results for this patient.**  X-ray of the  lumbar spine reveals degenerative changes of the L3-L4, and L4-L5 areas. The portion of the pelvis visualized was intact. As noted above the patient had CT scan of the chest as well as a plain x-ray film of the chest and there were no rib area changes, no pneumonia, and no pulmonary embolus.  Suspect the patient has degenerative changes as well as some muscular changes. The patient will be treated with Norco, baclofen, prednisone, and triamcinolone.  Kathie DikeHobson M Lucill Mauck, PA-C 10/21/13 1206

## 2013-10-21 NOTE — ED Provider Notes (Signed)
Medical screening examination/treatment/procedure(s) were performed by non-physician practitioner and as supervising physician I was immediately available for consultation/collaboration.  EKG Interpretation   None         Joya Gaskinsonald W Danea Manter, MD 10/21/13 1229

## 2013-10-21 NOTE — Discharge Instructions (Signed)
Please use prednisone taper as prescribed, please use triamcinolone drained to the rash areas 2 times daily. Please see Dr. Margo AyeHall, dermatology if not improving.  Your x-ray shows arthritis in your back. Your exam suggests a muscle strain. Please use baclofen 3 times daily for spasm pain. Please use Norco for pain if needed. Both of these medications may cause drowsiness, please use with caution.

## 2013-10-21 NOTE — ED Notes (Signed)
Pt reports has had generalized rash x 1 week.  Reports had same a few weeks ago and was given some "pills" that made it go away.  Also c/o pain in r flank x 1 month.

## 2013-10-22 LAB — URINE CULTURE
COLONY COUNT: NO GROWTH
Culture: NO GROWTH

## 2016-10-25 ENCOUNTER — Emergency Department (HOSPITAL_COMMUNITY)
Admission: EM | Admit: 2016-10-25 | Discharge: 2016-10-25 | Disposition: A | Discharge status: Home / Self Care | Payer: Self-pay | Attending: Emergency Medicine | Admitting: Emergency Medicine

## 2016-10-25 ENCOUNTER — Encounter (HOSPITAL_COMMUNITY): Payer: Self-pay | Admitting: Emergency Medicine

## 2016-10-25 DIAGNOSIS — F17210 Nicotine dependence, cigarettes, uncomplicated: Secondary | ICD-10-CM | POA: Insufficient documentation

## 2016-10-25 DIAGNOSIS — L0291 Cutaneous abscess, unspecified: Secondary | ICD-10-CM

## 2016-10-25 DIAGNOSIS — L03818 Cellulitis of other sites: Secondary | ICD-10-CM

## 2016-10-25 DIAGNOSIS — N611 Abscess of the breast and nipple: Secondary | ICD-10-CM | POA: Insufficient documentation

## 2016-10-25 DIAGNOSIS — Z79899 Other long term (current) drug therapy: Secondary | ICD-10-CM | POA: Insufficient documentation

## 2016-10-25 MED ORDER — SULFAMETHOXAZOLE-TRIMETHOPRIM 800-160 MG PO TABS: 1.0000 | ORAL_TABLET | Freq: Two times a day (BID) | ORAL | 0 refills | Status: AC

## 2016-10-25 MED ORDER — HYDROCODONE-ACETAMINOPHEN 5-325 MG PO TABS: 1.0000 | ORAL_TABLET | ORAL | 0 refills | Status: AC | PRN

## 2016-10-25 MED ORDER — SULFAMETHOXAZOLE-TRIMETHOPRIM 800-160 MG PO TABS
1.0000 | ORAL_TABLET | Freq: Once | ORAL | Status: AC
  Administered 2016-10-25: 1 via ORAL
  Filled 2016-10-25: qty 1

## 2016-10-25 MED ORDER — HYDROCODONE-ACETAMINOPHEN 5-325 MG PO TABS
1.0000 | ORAL_TABLET | Freq: Once | ORAL | Status: AC
  Administered 2016-10-25: 1 via ORAL
  Filled 2016-10-25: qty 1

## 2016-10-25 NOTE — ED Provider Notes (Signed)
[facility]-EMERGENCY DEPT Provider Note   CSN: [id number] Arrival date & time: 10/25/16  1749   By signing my name below, I, Cynda AcresHailei Fulton, attest that this documentation has been prepared under the direction and in the presence of Burgess AmorJulie Windy Dudek, PA-C Electronically Signed: Cynda AcresHailei Fulton, Scribe. 10/25/16. 6:43 PM.  History   Chief Complaint Chief Complaint  Patient presents with  . Abscess    HPI Comments: Rebecca Hurst is a 56 y.o. female with a hx of recurrent abscesses, who presents to the Emergency Department complaining of a boil to the left nipple area that appeared this week. Patient states whenever she drinks coffee, she gets and abscess, "coffee messes her up". Patient has associated drainage and pain that began today. Patient is unsure when her last mammogram was, it was a long time ago. Patient states this is a recurrent problem and it usually spreads.she has had and I&D at this site in the past for a prior abscess.  She denies any other symptoms and has had no treatments prior to arrival.   The history is provided by the patient. No language interpreter was used.    Past Medical History:  Diagnosis Date  . Alcohol abuse     There are no active problems to display for this patient.   History reviewed. No pertinent surgical history.  OB History    No data available       Home Medications    Prior to Admission medications   Medication Sig Start Date End Date Taking? Authorizing Provider  HYDROcodone-acetaminophen (NORCO/VICODIN) 5-325 MG tablet Take 1 tablet by mouth every 4 (four) hours as needed. 10/25/16   Burgess AmorJulie Yulisa Chirico, PA-C  predniSONE (DELTASONE) 10 MG tablet 5,4,3,2,1 - take with food 10/21/13   Ivery QualeHobson Bryant, PA-C  sulfamethoxazole-trimethoprim (BACTRIM DS,SEPTRA DS) 800-160 MG tablet Take 1 tablet by mouth 2 (two) times daily. 10/25/16 11/01/16  Burgess AmorJulie Tomesha Sargent, PA-C  triamcinolone cream (KENALOG) 0.1 % Apply 1 application topically 2 (two) times daily. 10/21/13   Ivery QualeHobson  Bryant, PA-C    Family History History reviewed. No pertinent family history.  Social History Social History  Substance Use Topics  . Smoking status: Current Every Day Smoker    Packs/day: 0.50    Types: Cigarettes  . Smokeless tobacco: Never Used  . Alcohol use No     Allergies   Patient has no known allergies.   Review of Systems Review of Systems  Constitutional: Negative for chills and fever.  HENT: Negative for congestion and sore throat.   Eyes: Negative.   Respiratory: Negative for chest tightness and shortness of breath.   Cardiovascular: Negative.   Gastrointestinal: Negative for abdominal pain, nausea and vomiting.  Genitourinary: Negative.   Musculoskeletal: Negative for arthralgias, joint swelling and neck pain.  Skin: Positive for color change. Negative for rash and wound.       Negative except as mentioned in HPI.   Neurological: Negative for dizziness, weakness, light-headedness, numbness and headaches.  Psychiatric/Behavioral: Negative.      Physical Exam Updated Vital Signs BP 159/72 (BP Location: Left Arm)   Pulse 73   Temp 98.3 F (36.8 C) (Oral)   Resp 20   Ht 5\' 4"  (1.626 m)   Wt 77.1 kg   SpO2 100%   BMI 29.18 kg/m   Physical Exam  Constitutional: She is oriented to person, place, and time. She appears well-developed.  HENT:  Head: Normocephalic and atraumatic.  Mouth/Throat: Oropharynx is clear and moist.  Eyes: Conjunctivae and  EOM are normal. Pupils are equal, round, and reactive to light.  Neck: Normal range of motion. Neck supple.  Cardiovascular: Normal rate.   Pulmonary/Chest: Effort normal.  Abdominal: Soft. Bowel sounds are normal.  Neurological: She is alert and oriented to person, place, and time.  Skin: Skin is warm and dry.  She has draining purulence from the left nipple with a 1 cm area of induration and a 4 cm area of surrounding cellulitis. She has no left axillary adenopathy. No red streaking.  Old I&D site  identified at the site of current draining infection. No fluctuance.     ED Treatments / Results  DIAGNOSTIC STUDIES: Oxygen Saturation is 100% on RA, normal by my interpretation.    COORDINATION OF CARE: 6:37 PM Discussed treatment plan with pt at bedside and pt agreed to plan.  Labs (all labs ordered are listed, but only abnormal results are displayed) Labs Reviewed - No data to display  EKG  EKG Interpretation None       Radiology No results found.  Procedures Procedures (including critical care time)  Medications Ordered in ED Medications  sulfamethoxazole-trimethoprim (BACTRIM DS,SEPTRA DS) 800-160 MG per tablet 1 tablet (1 tablet Oral Given 10/25/16 1906)  HYDROcodone-acetaminophen (NORCO/VICODIN) 5-325 MG per tablet 1 tablet (1 tablet Oral Given 10/25/16 1906)     Initial Impression / Assessment and Plan / ED Course  I have reviewed the triage vital signs and the nursing notes.  Pertinent labs & imaging results that were available during my care of the patient were reviewed by me and considered in my medical decision making (see chart for details).  Clinical Course    Antibiotics, pain medication, and she is being scheduled for an outpatient diagnostic ultrasound.  Advised warm compresses, recheck here for any worsening pain or spreading redness.  No obvious breast mass identified, but exam is limited by pain.  Final Clinical Impressions(s) / ED Diagnoses   Final diagnoses:  Abscess  Cellulitis of other specified site    New Prescriptions Discharge Medication List as of 10/25/2016  6:50 PM     I personally performed the services described in this documentation, which was scribed in my presence. The recorded information has been reviewed and is accurate.     Burgess Amor, PA-C 10/26/16 1419    Raeford Razor, MD 11/01/16 1440

## 2016-10-25 NOTE — Discharge Instructions (Signed)
Take your entire course of the antibiotics prescribed.  You may take the hydrocodone prescribed for pain relief.  This will make you drowsy - do not drive within 4 hours of taking this medication. As discussed an ultrasound has been ordered for this breast to make sure this is a simple infection and not a breast mass.  You should receive a call to schedule this test tomorrow.

## 2016-10-25 NOTE — ED Triage Notes (Signed)
Patient complaining of abscess to nipple area on left breast since yesterday.

## 2020-07-27 ENCOUNTER — Other Ambulatory Visit: Payer: Self-pay

## 2020-07-27 ENCOUNTER — Emergency Department (HOSPITAL_COMMUNITY)
Admission: EM | Admit: 2020-07-27 | Discharge: 2020-07-27 | Disposition: A | Discharge status: Home / Self Care | Payer: Self-pay | Attending: Emergency Medicine | Admitting: Emergency Medicine

## 2020-07-27 ENCOUNTER — Encounter (HOSPITAL_COMMUNITY): Payer: Self-pay

## 2020-07-27 ENCOUNTER — Emergency Department (HOSPITAL_COMMUNITY): Payer: Self-pay

## 2020-07-27 DIAGNOSIS — R1084 Generalized abdominal pain: Secondary | ICD-10-CM | POA: Insufficient documentation

## 2020-07-27 DIAGNOSIS — E876 Hypokalemia: Secondary | ICD-10-CM | POA: Insufficient documentation

## 2020-07-27 DIAGNOSIS — F17210 Nicotine dependence, cigarettes, uncomplicated: Secondary | ICD-10-CM | POA: Insufficient documentation

## 2020-07-27 DIAGNOSIS — R112 Nausea with vomiting, unspecified: Secondary | ICD-10-CM | POA: Insufficient documentation

## 2020-07-27 LAB — URINALYSIS, ROUTINE W REFLEX MICROSCOPIC
Bacteria, UA: NONE SEEN
Bilirubin Urine: NEGATIVE
Glucose, UA: 50 mg/dL — AB
Ketones, ur: 20 mg/dL — AB
Nitrite: NEGATIVE
Protein, ur: NEGATIVE mg/dL
RBC / HPF: >50 RBC/hpf — ABNORMAL HIGH (ref 0–5)
Specific Gravity, Urine: 1.013 (ref 1.005–1.030)
WBC, UA: >50 WBC/hpf — ABNORMAL HIGH (ref 0–5)
pH: 8 (ref 5.0–8.0)

## 2020-07-27 LAB — COMPREHENSIVE METABOLIC PANEL
ALT: 19 U/L (ref 0–44)
AST: 22 U/L (ref 15–41)
Albumin: 4.1 g/dL (ref 3.5–5.0)
Alkaline Phosphatase: 79 U/L (ref 38–126)
Anion gap: 11 (ref 5–15)
BUN: 11 mg/dL (ref 6–20)
CO2: 23 mmol/L (ref 22–32)
Calcium: 9.2 mg/dL (ref 8.9–10.3)
Chloride: 101 mmol/L (ref 98–111)
Creatinine, Ser: 0.69 mg/dL (ref 0.44–1.00)
GFR, Estimated: >60 mL/min (ref >60)
Glucose, Bld: 163 mg/dL — ABNORMAL HIGH (ref 70–99)
Potassium: 2.7 mmol/L — CL (ref 3.5–5.1)
Sodium: 135 mmol/L (ref 135–145)
Total Bilirubin: 1.3 mg/dL — ABNORMAL HIGH (ref 0.3–1.2)
Total Protein: 7.9 g/dL (ref 6.5–8.1)

## 2020-07-27 LAB — CBC
HCT: 38.5 % (ref 36.0–46.0)
Hemoglobin: 13.3 g/dL (ref 12.0–15.0)
MCH: 33.1 pg (ref 26.0–34.0)
MCHC: 34.5 g/dL (ref 30.0–36.0)
MCV: 95.8 fL (ref 80.0–100.0)
Platelets: 232 10*3/uL (ref 150–400)
RBC: 4.02 MIL/uL (ref 3.87–5.11)
RDW: 13 % (ref 11.5–15.5)
WBC: 16.6 10*3/uL — ABNORMAL HIGH (ref 4.0–10.5)
nRBC: 0 % (ref 0.0–0.2)

## 2020-07-27 LAB — MAGNESIUM: Magnesium: 1.7 mg/dL (ref 1.7–2.4)

## 2020-07-27 LAB — LIPASE, BLOOD: Lipase: 21 U/L (ref 11–51)

## 2020-07-27 LAB — LACTIC ACID, PLASMA
Lactic Acid, Venous: 1.6 mmol/L (ref 0.5–1.9)
Lactic Acid, Venous: 1.1 mmol/L (ref 0.5–1.9)

## 2020-07-27 LAB — ETHANOL: Alcohol, Ethyl (B): <10 mg/dL (ref <10)

## 2020-07-27 MED ORDER — SODIUM CHLORIDE 0.9 % IV SOLN
1.0000 g | Freq: Once | INTRAVENOUS | Status: AC
  Administered 2020-07-27: 1 g via INTRAVENOUS
  Filled 2020-07-27: qty 10

## 2020-07-27 MED ORDER — IOHEXOL 9 MG/ML PO SOLN
ORAL | Status: AC
  Filled 2020-07-27: qty 1000

## 2020-07-27 MED ORDER — IOHEXOL 300 MG/ML  SOLN
100.0000 mL | Freq: Once | INTRAMUSCULAR | Status: AC | PRN
  Administered 2020-07-27: 100 mL via INTRAVENOUS

## 2020-07-27 MED ORDER — POTASSIUM CHLORIDE 10 MEQ/100ML IV SOLN
10.0000 meq | INTRAVENOUS | Status: AC
  Administered 2020-07-27 (×4): 10 meq via INTRAVENOUS
  Filled 2020-07-27 (×4): qty 100

## 2020-07-27 MED ORDER — POTASSIUM CHLORIDE CRYS ER 20 MEQ PO TBCR: 20.0000 meq | EXTENDED_RELEASE_TABLET | Freq: Two times a day (BID) | ORAL | 0 refills | Status: AC

## 2020-07-27 MED ORDER — SODIUM CHLORIDE 0.9 % IV BOLUS
1000.0000 mL | Freq: Once | INTRAVENOUS | Status: AC
  Administered 2020-07-27: 1000 mL via INTRAVENOUS

## 2020-07-27 MED ORDER — FENTANYL CITRATE (PF) 100 MCG/2ML IJ SOLN
50.0000 ug | Freq: Once | INTRAMUSCULAR | Status: AC
  Administered 2020-07-27: 50 ug via INTRAVENOUS
  Filled 2020-07-27: qty 2

## 2020-07-27 MED ORDER — ONDANSETRON HCL 4 MG PO TABS: 4.0000 mg | ORAL_TABLET | Freq: Three times a day (TID) | ORAL | 0 refills | Status: AC | PRN

## 2020-07-27 MED ORDER — ONDANSETRON HCL 4 MG/2ML IJ SOLN
4.0000 mg | Freq: Once | INTRAMUSCULAR | Status: AC
  Administered 2020-07-27: 4 mg via INTRAVENOUS
  Filled 2020-07-27: qty 2

## 2020-07-27 MED ORDER — CEPHALEXIN 500 MG PO CAPS: 500.0000 mg | ORAL_CAPSULE | Freq: Three times a day (TID) | ORAL | 0 refills | Status: DC

## 2020-07-27 NOTE — ED Triage Notes (Signed)
Pt arrives via [facility] for abdominal pain started earlier today.  +n/v 4 zofran  4 morphine  22r ac  Hypertensive with [facility].

## 2020-07-27 NOTE — ED Notes (Signed)
Date and time results received: 07/27/20 0102   Test: K+ Critical Value: 2.7  Name of Provider Notified: Devoria Albe MD

## 2020-07-27 NOTE — ED Notes (Signed)
Pt returned from CT Scan 

## 2020-07-27 NOTE — ED Provider Notes (Signed)
[facility] EMERGENCY DEPARTMENT Provider Note   CSN: [id number] Arrival date & time: 07/27/20  0006   Time seen 1:05 AM  History Chief Complaint  Patient presents with  . Abdominal Pain    Rebecca Hurst is a 59 y.o. female.  HPI   Patient was brought to the emergency department by EMS for complaints of abdominal pain.  They gave her morphine and Zofran prior to arrival.  When I try to talk to the patient she asked like I am bothering her and she wants me to leave her alone.  She starts telling me that she started having pain tonight "later in the evening" around 7 PM and states she was cleaning the house and picked up a jug of dried dog food.  When I asked her if that had anything to do with her symptoms tonight she stated no.  When I asked her why she told me that she states "I thought you wanted to know everything".  She then states she drinks some type of juice which was punch that she has never had before and shortly after that she started feeling bad.  She has had nausea and vomiting and diffuse abdominal pain but denies diarrhea.  She denies any prior surgeries.  She states she is never felt this way before.  PCP Patient, No Pcp Per   Past Medical History:  Diagnosis Date  . Alcohol abuse     There are no problems to display for this patient.   History reviewed. No pertinent surgical history.   OB History   No obstetric history on file.     History reviewed. No pertinent family history.  Social History   Tobacco Use  . Smoking status: Current Every Day Smoker    Packs/day: 0.50    Types: Cigarettes  . Smokeless tobacco: Never Used  Substance Use Topics  . Alcohol use: No  . Drug use: No    Home Medications Prior to Admission medications   Medication Sig Start Date End Date Taking? Authorizing Provider  cephALEXin (KEFLEX) 500 MG capsule Take 1 capsule (500 mg total) by mouth 3 (three) times daily. 07/27/20   Devoria AlbeKnapp, Areli Jowett, MD  HYDROcodone-acetaminophen  (NORCO/VICODIN) 5-325 MG tablet Take 1 tablet by mouth every 4 (four) hours as needed. 10/25/16   Burgess AmorIdol, Julie, PA-C  ondansetron (ZOFRAN) 4 MG tablet Take 1 tablet (4 mg total) by mouth every 8 (eight) hours as needed. 07/27/20   Devoria AlbeKnapp, Alastor Kneale, MD  potassium chloride SA (KLOR-CON) 20 MEQ tablet Take 1 tablet (20 mEq total) by mouth 2 (two) times daily. 07/27/20   Devoria AlbeKnapp, Tajanay Hurley, MD  predniSONE (DELTASONE) 10 MG tablet 5,4,3,2,1 - take with food 10/21/13   Ivery QualeBryant, Hobson, PA-C  triamcinolone cream (KENALOG) 0.1 % Apply 1 application topically 2 (two) times daily. 10/21/13   Ivery QualeBryant, Hobson, PA-C    Allergies    Patient has no known allergies.  Review of Systems   Review of Systems  All other systems reviewed and are negative.   Physical Exam Updated Vital Signs BP (!) 174/65   Pulse 64   Temp 98.3 F (36.8 C) (Oral)   Resp 17   Ht 5\' 4"  (1.626 m)   Wt 49.4 kg   SpO2 100%   BMI 18.71 kg/m   Physical Exam Vitals and nursing note reviewed.  Constitutional:      General: She is not in acute distress.    Appearance: She is obese.     Comments: Sleeping  laying on her left side  HENT:     Head: Normocephalic and atraumatic.  Eyes:     Extraocular Movements: Extraocular movements intact.     Conjunctiva/sclera: Conjunctivae normal.     Pupils: Pupils are equal, round, and reactive to light.  Cardiovascular:     Rate and Rhythm: Normal rate and regular rhythm.     Pulses: Normal pulses.     Heart sounds: Normal heart sounds. No murmur heard.   Pulmonary:     Effort: Pulmonary effort is normal. No respiratory distress.     Breath sounds: Normal breath sounds.  Abdominal:     General: There is no distension.     Palpations: Abdomen is soft.     Tenderness: There is abdominal tenderness. There is no guarding or rebound.     Comments: Although patient states her pain is diffuse she was extremely uncomfortable when I palpated her right lower quadrant and at that was consistent. Patient is  actively vomiting clear fluid during my exam.  Musculoskeletal:        General: Normal range of motion.     Cervical back: Normal range of motion.  Skin:    General: Skin is warm and dry.  Neurological:     General: No focal deficit present.     Cranial Nerves: No cranial nerve deficit.  Psychiatric:        Mood and Affect: Affect is flat.        Speech: Speech is delayed and slurred.        Behavior: Behavior is slowed.     Comments: Patient mumbles when she talks     ED Results / Procedures / Treatments   Labs (all labs ordered are listed, but only abnormal results are displayed) Results for orders placed or performed during the hospital encounter of 07/27/20  Lipase, blood  Result Value Ref Range   Lipase 21 11 - 51 U/L  Comprehensive metabolic panel  Result Value Ref Range   Sodium 135 135 - 145 mmol/L   Potassium 2.7 (LL) 3.5 - 5.1 mmol/L   Chloride 101 98 - 111 mmol/L   CO2 23 22 - 32 mmol/L   Glucose, Bld 163 (H) 70 - 99 mg/dL   BUN 11 6 - 20 mg/dL   Creatinine, Ser 0.35 0.44 - 1.00 mg/dL   Calcium 9.2 8.9 - 00.9 mg/dL   Total Protein 7.9 6.5 - 8.1 g/dL   Albumin 4.1 3.5 - 5.0 g/dL   AST 22 15 - 41 U/L   ALT 19 0 - 44 U/L   Alkaline Phosphatase 79 38 - 126 U/L   Total Bilirubin 1.3 (H) 0.3 - 1.2 mg/dL   GFR, Estimated >38 >18 mL/min   Anion gap 11 5 - 15  CBC  Result Value Ref Range   WBC 16.6 (H) 4.0 - 10.5 K/uL   RBC 4.02 3.87 - 5.11 MIL/uL   Hemoglobin 13.3 12.0 - 15.0 g/dL   HCT 29.9 36 - 46 %   MCV 95.8 80.0 - 100.0 fL   MCH 33.1 26.0 - 34.0 pg   MCHC 34.5 30.0 - 36.0 g/dL   RDW 37.1 69.6 - 78.9 %   Platelets 232 150 - 400 K/uL   nRBC 0.0 0.0 - 0.2 %  Urinalysis, Routine w reflex microscopic Urine, Clean Catch  Result Value Ref Range   Color, Urine YELLOW YELLOW   APPearance CLEAR CLEAR   Specific Gravity, Urine 1.013 1.005 - 1.030  pH 8.0 5.0 - 8.0   Glucose, UA 50 (A) NEGATIVE mg/dL   Hgb urine dipstick MODERATE (A) NEGATIVE   Bilirubin  Urine NEGATIVE NEGATIVE   Ketones, ur 20 (A) NEGATIVE mg/dL   Protein, ur NEGATIVE NEGATIVE mg/dL   Nitrite NEGATIVE NEGATIVE   Leukocytes,Ua SMALL (A) NEGATIVE   RBC / HPF >50 (H) 0 - 5 RBC/hpf   WBC, UA >50 (H) 0 - 5 WBC/hpf   Bacteria, UA NONE SEEN NONE SEEN   Squamous Epithelial / LPF 0-5 0 - 5   Mucus PRESENT   Ethanol  Result Value Ref Range   Alcohol, Ethyl (B) <10 <10 mg/dL  Lactic acid, plasma  Result Value Ref Range   Lactic Acid, Venous 1.6 0.5 - 1.9 mmol/L  Lactic acid, plasma  Result Value Ref Range   Lactic Acid, Venous 1.1 0.5 - 1.9 mmol/L  Magnesium  Result Value Ref Range   Magnesium 1.7 1.7 - 2.4 mg/dL   Laboratory interpretation all normal except leukocytosis, hypokalemia, hyperglycemia    EKG EKG Interpretation  Date/Time:  Sunday July 27 2020 02:02:34 EDT Ventricular Rate:  79 PR Interval:    QRS Duration: 115 QT Interval:  444 QTC Calculation: 417 R Axis:   24 Text Interpretation: Ectopic atrial rhythm Ventricular bigeminy Short PR interval Nonspecific intraventricular conduction delay Anteroseptal infarct, age indeterminate U waves present No old tracing to compare Confirmed by Devoria Albe ([id number]) on 07/27/2020 2:10:50 AM   Radiology CT Abdomen Pelvis W Contrast  Result Date: 07/27/2020 CLINICAL DATA:  59 year old female with right lower quadrant abdominal pain. EXAM: CT ABDOMEN AND PELVIS WITH CONTRAST TECHNIQUE: Multidetector CT imaging of the abdomen and pelvis was performed using the standard protocol following bolus administration of intravenous contrast. CONTRAST:  OMNIPAQUE IOHEXOL 300 MG/ML  SOLN COMPARISON:  None. FINDINGS: Lower chest: Right lung base linear atelectasis/scarring. The visualized lung bases are otherwise clear. No intra-abdominal free air.  Trace free fluid in the pelvis. Hepatobiliary: The liver is unremarkable. No intrahepatic biliary ductal dilatation. The gallbladder is unremarkable. Pancreas: Unremarkable. No  pancreatic ductal dilatation or surrounding inflammatory changes. Spleen: Normal in size without focal abnormality. Adrenals/Urinary Tract: The adrenal glands unremarkable. There is a 3 cm left renal interpolar cyst. Subcentimeter right renal interpolar hypodense lesion is too small to characterize. There is no hydronephrosis on either side. There is symmetric enhancement and excretion of contrast by both kidneys. The visualized ureters and urinary bladder appear unremarkable. Stomach/Bowel: There is colonic diverticulosis without active inflammatory changes. Mild thickened appearance of the colon, likely related to underdistention. No bowel obstruction. Normal appendix. Vascular/Lymphatic: Mild aortoiliac atherosclerotic disease. The IVC is unremarkable. No portal venous gas. There is no adenopathy. Reproductive: Multiple uterine fibroids measuring up to 6.5 cm. There is small amount of fluid and air within the endometrium of indeterminate etiology. In the absence of recent instrumentation and infectious process/endometritis is a possibility. Clinical correlation recommended an. Evaluation with ultrasound recommended. Other: None Musculoskeletal: Mild degenerative changes of the spine. No acute osseous pathology. IMPRESSION: 1. Colonic diverticulosis. No bowel obstruction. Normal appendix. 2. Multiple uterine fibroids. 3. Small amount of fluid and air within the endometrium of indeterminate etiology. In the absence of recent instrumentation and infectious process/endometritis is a possibility. Clinical correlation recommended. Further evaluation with ultrasound recommended to exclude an endometrial pathology. 4. Aortic Atherosclerosis (ICD10-I70.0). Electronically Signed   By: Elgie Collard M.D.   On: 07/27/2020 03:47    Procedures Procedures (including critical care time)  Medications  Ordered in ED Medications  potassium chloride 10 mEq in 100 mL IVPB (10 mEq Intravenous New Bag/Given 07/27/20 0525)    sodium chloride 0.9 % bolus 1,000 mL (1,000 mLs Intravenous New Bag/Given 07/27/20 0200)  fentaNYL (SUBLIMAZE) injection 50 mcg (50 mcg Intravenous Given 07/27/20 0150)  ondansetron (ZOFRAN) injection 4 mg (4 mg Intravenous Given 07/27/20 0149)  iohexol (OMNIPAQUE) 9 MG/ML oral solution (  Contrast Given 07/27/20 0445)  iohexol (OMNIPAQUE) 300 MG/ML solution 100 mL (100 mLs Intravenous Contrast Given 07/27/20 0315)  cefTRIAXone (ROCEPHIN) 1 g in sodium chloride 0.9 % 100 mL IVPB (0 g Intravenous Stopped 07/27/20 0517)    ED Course  I have reviewed the triage vital signs and the nursing notes.  Pertinent labs & imaging results that were available during my care of the patient were reviewed by me and considered in my medical decision making (see chart for details).    MDM Rules/Calculators/A&P                          Patient's history was vague, however her exam was worrisome for acute appendicitis.  CT scan of the abdomen pelvis was pursued.  Patient's potassium was low she was given IV potassium due to her vomiting.  Magnesium level was added.  CT scan of the abdomen pelvis has been resulted and has questionable findings in her uterus.  Patient states she does not have periods anymore.  She denies having any "female problems".  She does appear to have a UTI and was given Rocephin IV.  Recheck at 6:00 AM patient's IV potassium runs have almost finished.  She states she is feeling better.  She states her pain is gone.  She does not want to wait and have an ultrasound done.  She states she feels well enough to go home.  Final Clinical Impression(s) / ED Diagnoses Final diagnoses:  Generalized abdominal pain  Non-intractable vomiting with nausea, unspecified vomiting type  Hypokalemia    Rx / DC Orders ED Discharge Orders         Ordered    potassium chloride SA (KLOR-CON) 20 MEQ tablet  2 times daily        07/27/20 0615    ondansetron (ZOFRAN) 4 MG tablet  Every 8 hours PRN         07/27/20 0615    cephALEXin (KEFLEX) 500 MG capsule  3 times daily        07/27/20 0615         Plan discharge  Devoria Albe, MD, Concha Pyo, MD 07/27/20 [phone]

## 2020-07-27 NOTE — ED Notes (Signed)
Patient transported to CT 

## 2020-07-27 NOTE — Discharge Instructions (Signed)
Drink plenty of fluids (clear liquids) then start a bland diet later this morning such as toast, crackers, jello, [employer] chicken noodle soup. Use the zofran for nausea or vomiting.  Take the potassium pills and the antibiotics until gone.  Return to the emergency department if you get worse again.

## 2020-07-28 LAB — URINE CULTURE

## 2022-09-14 IMAGING — CT CT ABD-PELV W/ CM
2 of 5 series · 15 of 46 positions shown, 17 images · IV contrast (Omnipaque or Isovue)
Comparison: None.

CLINICAL DATA: 58-year-old female with right lower quadrant
abdominal pain.

EXAM:
CT ABDOMEN AND PELVIS WITH CONTRAST
TECHNIQUE: Multidetector CT imaging of the abdomen and pelvis was performed
using the standard protocol following bolus administration of
intravenous contrast.
CONTRAST:  100mL OMNIPAQUE IOHEXOL 300 MG/ML  SOLN

[Series 2: axial st · axial · 0.62mm/px · z∈[+853,+1238]mm · 12 of 87 slices shown, 14 images]
[im 5/87  soft-tissue]
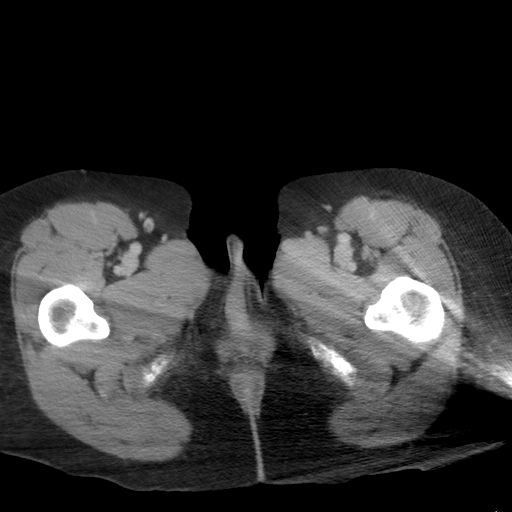
[im 5/87  bone]
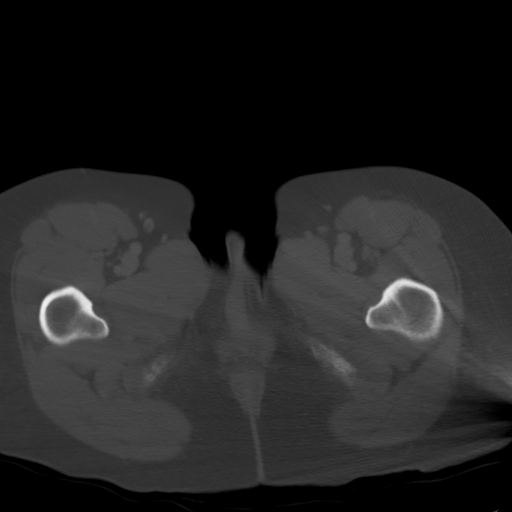
[im 15/87  soft-tissue]
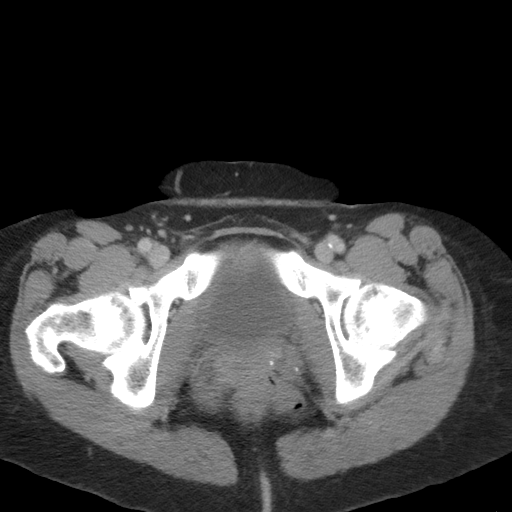
[im 20/87  soft-tissue]
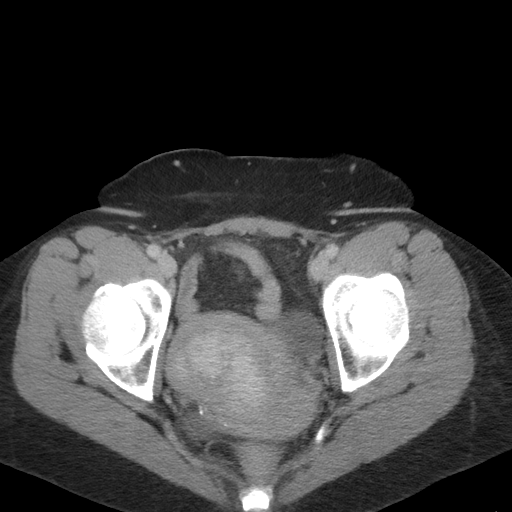
[im 24/87  soft-tissue]
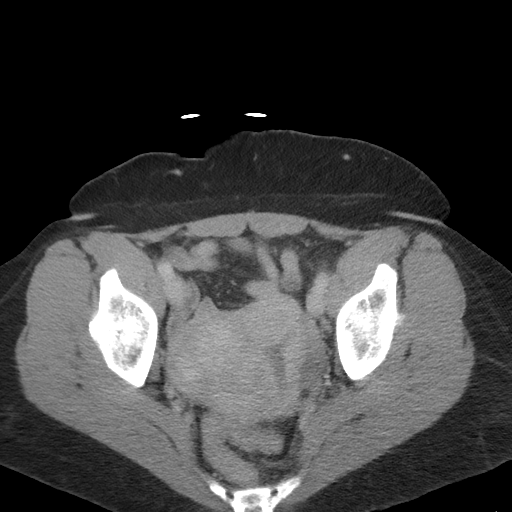
[im 34/87  soft-tissue]
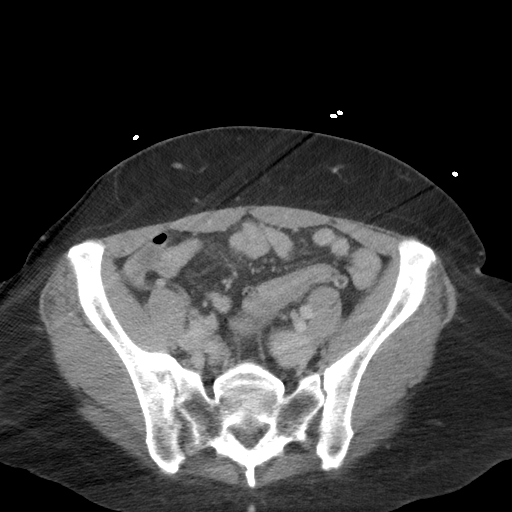
[im 39/87  soft-tissue]
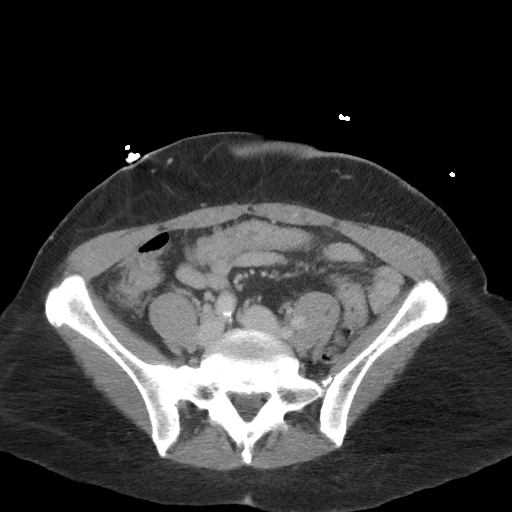
[im 48/87  soft-tissue]
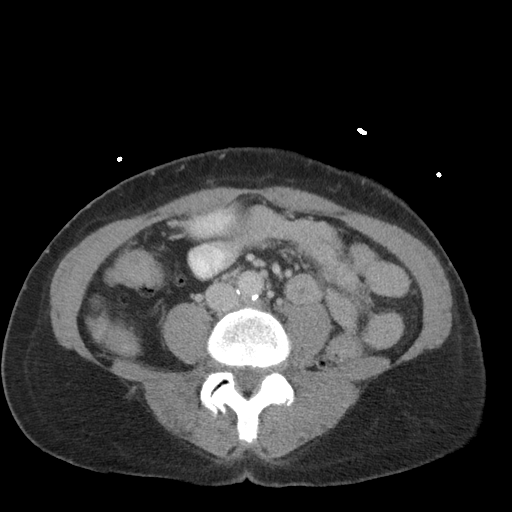
[im 53/87  soft-tissue]
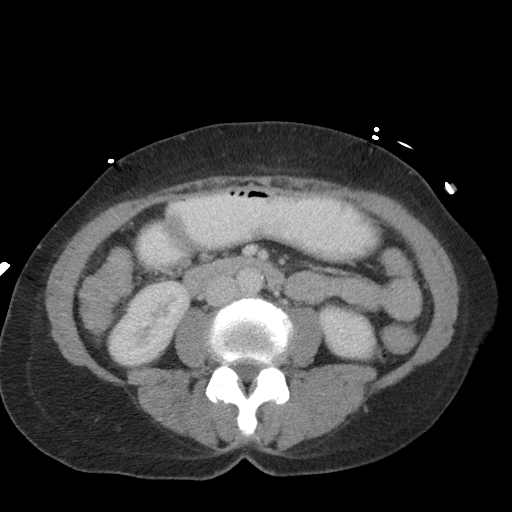
[im 63/87  soft-tissue]
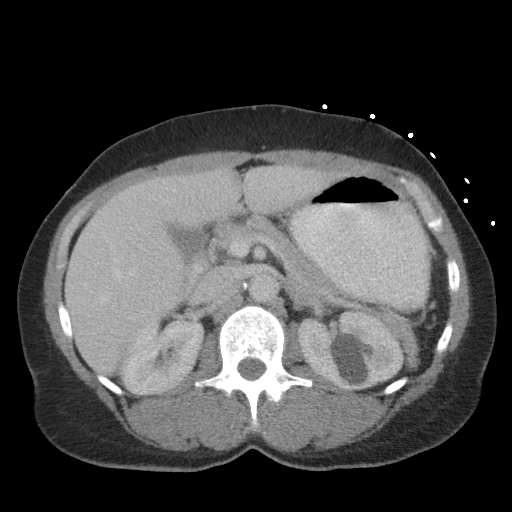
[im 63/87  bone]
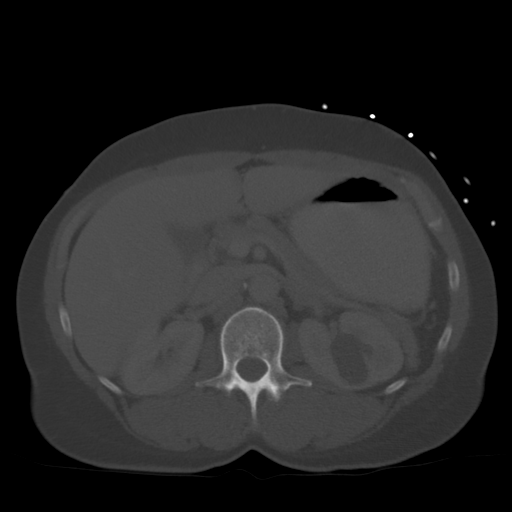
[im 67/87  soft-tissue]
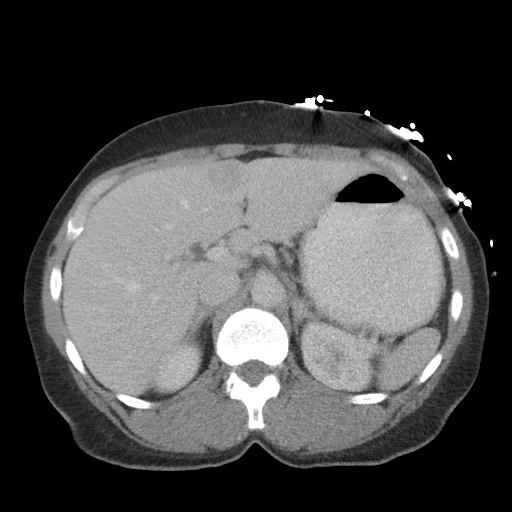
[im 72/87  soft-tissue]
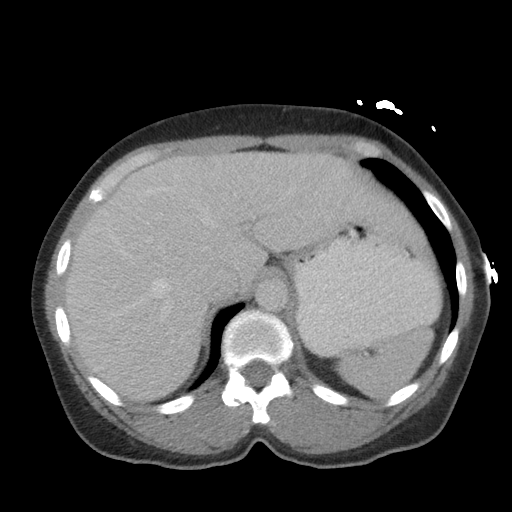
[im 82/87  soft-tissue]
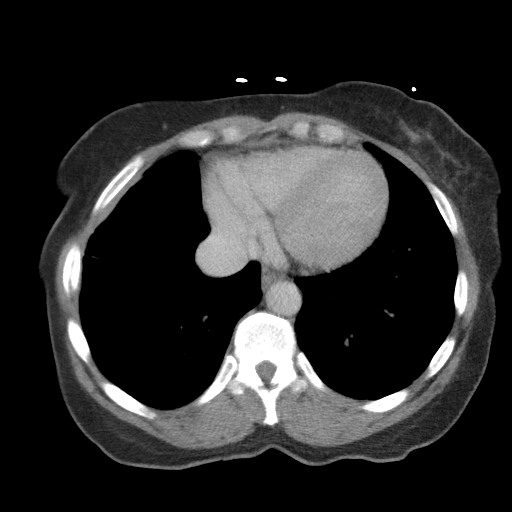

[Series 5: coronal st · coronal · 0.56mm/px · 3 of 85 slices shown]
[im 29/85  soft-tissue]
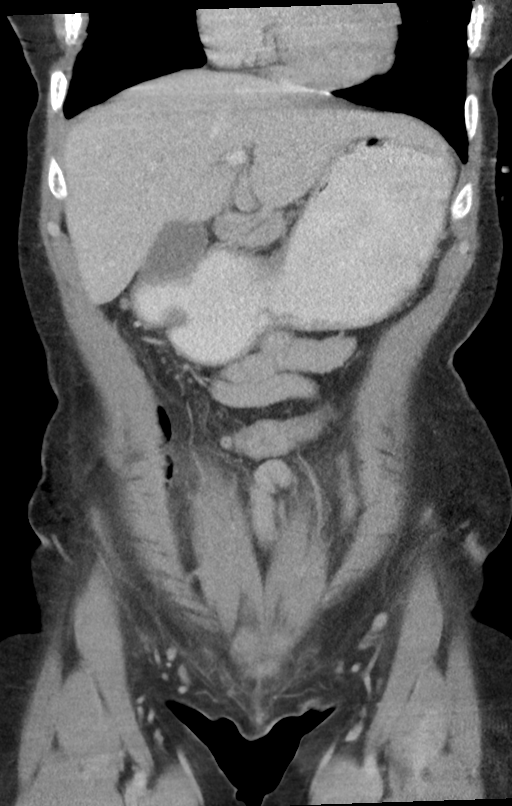
[im 38/85  soft-tissue]
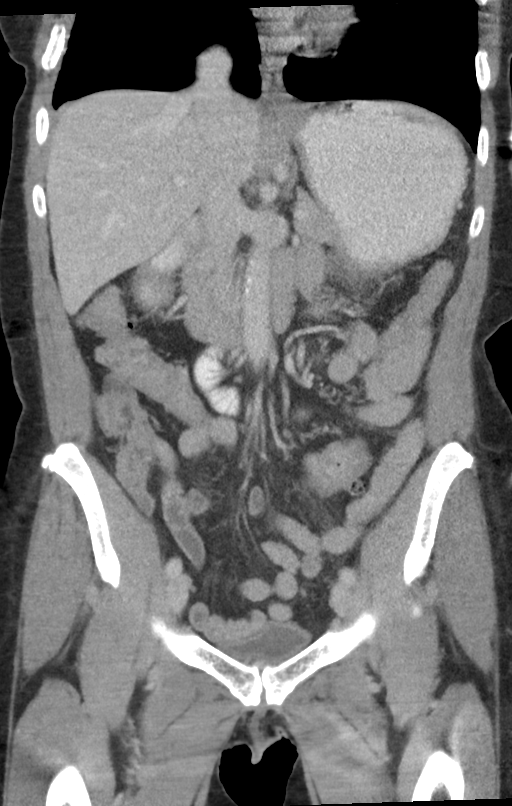
[im 47/85  soft-tissue]
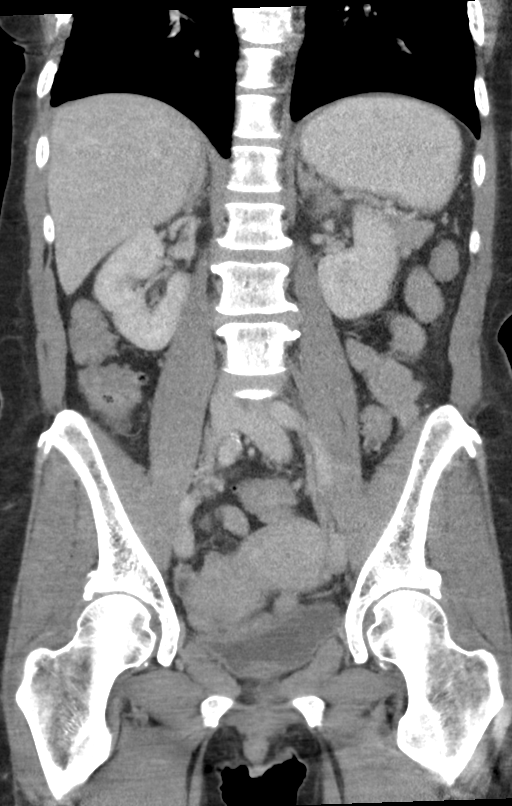

[15 of 46 positions shown; findings below may reference images not displayed]

FINDINGS: Lower chest: Right lung base linear atelectasis/scarring. The
visualized lung bases are otherwise clear.

No intra-abdominal free air.  Trace free fluid in the pelvis.

Hepatobiliary: The liver is unremarkable. No intrahepatic biliary
ductal dilatation. The gallbladder is unremarkable.

Pancreas: Unremarkable. No pancreatic ductal dilatation or
surrounding inflammatory changes.

Spleen: Normal in size without focal abnormality.

Adrenals/Urinary Tract: The adrenal glands unremarkable. There is a
3 cm left renal interpolar cyst. Subcentimeter right renal
interpolar hypodense lesion is too small to characterize. There is
no hydronephrosis on either side. There is symmetric enhancement and
excretion of contrast by both kidneys. The visualized ureters and
urinary bladder appear unremarkable.

Stomach/Bowel: There is colonic diverticulosis without active
inflammatory changes. Mild thickened appearance of the colon, likely
related to underdistention. No bowel obstruction. Normal appendix.

Vascular/Lymphatic: Mild aortoiliac atherosclerotic disease. The IVC
is unremarkable. No portal venous gas. There is no adenopathy.

Reproductive: Multiple uterine fibroids measuring up to 6.5 cm.
There is small amount of fluid and air within the endometrium of
indeterminate etiology. In the absence of recent instrumentation and
infectious process/endometritis is a possibility. Clinical
correlation recommended an. Evaluation with ultrasound recommended.

Other: None

Musculoskeletal: Mild degenerative changes of the spine. No acute
osseous pathology.
IMPRESSION: 1. Colonic diverticulosis. No bowel obstruction. Normal appendix.
2. Multiple uterine fibroids.
3. Small amount of fluid and air within the endometrium of
indeterminate etiology. In the absence of recent instrumentation and
infectious process/endometritis is a possibility. Clinical
correlation recommended. Further evaluation with ultrasound
recommended to exclude an endometrial pathology.
4. Aortic Atherosclerosis ([id number]).

## 2022-12-30 ENCOUNTER — Other Ambulatory Visit: Payer: Self-pay

## 2022-12-30 ENCOUNTER — Encounter (HOSPITAL_COMMUNITY): Payer: Self-pay

## 2022-12-30 ENCOUNTER — Emergency Department (HOSPITAL_COMMUNITY)
Admission: EM | Admit: 2022-12-30 | Discharge: 2022-12-30 | Disposition: A | Discharge status: Home / Self Care | Payer: Self-pay | Attending: Emergency Medicine | Admitting: Emergency Medicine

## 2022-12-30 ENCOUNTER — Emergency Department (HOSPITAL_COMMUNITY): Payer: Self-pay

## 2022-12-30 DIAGNOSIS — S81811A Laceration without foreign body, right lower leg, initial encounter: Secondary | ICD-10-CM | POA: Insufficient documentation

## 2022-12-30 DIAGNOSIS — W540XXA Bitten by dog, initial encounter: Secondary | ICD-10-CM | POA: Insufficient documentation

## 2022-12-30 DIAGNOSIS — Z2914 Encounter for prophylactic rabies immune globin: Secondary | ICD-10-CM | POA: Insufficient documentation

## 2022-12-30 DIAGNOSIS — Z23 Encounter for immunization: Secondary | ICD-10-CM | POA: Insufficient documentation

## 2022-12-30 DIAGNOSIS — F17210 Nicotine dependence, cigarettes, uncomplicated: Secondary | ICD-10-CM | POA: Insufficient documentation

## 2022-12-30 DIAGNOSIS — I10 Essential (primary) hypertension: Secondary | ICD-10-CM | POA: Insufficient documentation

## 2022-12-30 MED ORDER — RABIES IMMUNE GLOBULIN 150 UNIT/ML IM INJ
20.0000 [IU]/kg | INJECTION | Freq: Once | INTRAMUSCULAR | Status: AC
  Administered 2022-12-30: 1350 [IU]
  Filled 2022-12-30: qty 10

## 2022-12-30 MED ORDER — RABIES IMMUNE GLOBULIN 150 UNIT/ML IM INJ
INJECTION | INTRAMUSCULAR | Status: AC
  Filled 2022-12-30: qty 10

## 2022-12-30 MED ORDER — OXYCODONE-ACETAMINOPHEN 5-325 MG PO TABS
1.0000 | ORAL_TABLET | Freq: Once | ORAL | Status: AC
  Administered 2022-12-30: 1 via ORAL
  Filled 2022-12-30: qty 1

## 2022-12-30 MED ORDER — AMOXICILLIN-POT CLAVULANATE 875-125 MG PO TABS
1.0000 | ORAL_TABLET | Freq: Once | ORAL | Status: AC
  Administered 2022-12-30: 1 via ORAL
  Filled 2022-12-30: qty 1

## 2022-12-30 MED ORDER — LIDOCAINE-EPINEPHRINE (PF) 2 %-1:200000 IJ SOLN
10.0000 mL | Freq: Once | INTRAMUSCULAR | Status: AC
  Administered 2022-12-30: 10 mL
  Filled 2022-12-30: qty 20

## 2022-12-30 MED ORDER — RABIES VACCINE, PCEC IM SUSR
1.0000 mL | Freq: Once | INTRAMUSCULAR | Status: AC
  Administered 2022-12-30: 1 mL via INTRAMUSCULAR
  Filled 2022-12-30: qty 1

## 2022-12-30 MED ORDER — AMOXICILLIN-POT CLAVULANATE 875-125 MG PO TABS: 1.0000 | ORAL_TABLET | Freq: Two times a day (BID) | ORAL | 0 refills | Status: AC

## 2022-12-30 MED ORDER — TETANUS-DIPHTH-ACELL PERTUSSIS 5-2.5-18.5 LF-MCG/0.5 IM SUSY
0.5000 mL | PREFILLED_SYRINGE | Freq: Once | INTRAMUSCULAR | Status: AC
  Administered 2022-12-30: 0.5 mL via INTRAMUSCULAR
  Filled 2022-12-30: qty 0.5

## 2022-12-30 NOTE — ED Notes (Signed)
[employer] at bedside to make report with pt concerning dog bite.

## 2022-12-30 NOTE — ED Triage Notes (Signed)
Pt reports she did not realize she was close enough to her neighbors dog on a chain for him to reach her.  Pt bit on the right calf.

## 2022-12-30 NOTE — Discharge Instructions (Addendum)
Note the workup today is overall reassuring.  As discussed, keep area clean and dry.  Recommend reevaluation within 2 to 3 days for reassessment of appearance of wound.  Look for extending signs of infection as we discussed and return if notice. Today we began your rabies vaccination series.  This is day 0.  You are to return to ED/urgent care/health department on days 3, 7, 14 for additional vaccinations. That is 3/24, 3/28, 4,4. We have placed you on antibiotics in the form of Augmentin to take for prevention of infection. Non-dissolvable sutures were place in area. They are to be removed or at least re-evaluated for potential removal in 7-10 days.  Please do not hesitate to return to the emergency department for worrisome signs and symptoms we discussed become apparent.

## 2022-12-30 NOTE — ED Provider Notes (Signed)
[facility] Provider Note   CSN: [id number] Arrival date & time: 12/30/22  1715     History  Chief Complaint  Patient presents with   Animal Bite    Rebecca Hurst is a 62 y.o. female.   Animal Bite   62 year old female presents emergency department with complaints of dog bite.  Patient states that she was walking by her neighbors yard when she fell closer than expected to the nearby dog who was on his chain.  She says she try to get away but suffered a bite wound to her right calf area.  Bleeding controlled with direct pressure.  Patient denies any weakness or sensory deficits distally.  Denies blood thinner use.  Denies any other injury.  Patient unaware of vaccination status of dog and is requesting rabies vaccination series.  Patient also with greater than 10 years from prior tetanus vaccination  Past medical history significant for alcohol use  Home Medications Prior to Admission medications   Medication Sig Start Date End Date Taking? Authorizing Provider  amoxicillin-clavulanate (AUGMENTIN) 875-125 MG tablet Take 1 tablet by mouth every 12 (twelve) hours. 12/30/22  Yes Dion Saucier A, PA  HYDROcodone-acetaminophen (NORCO/VICODIN) 5-325 MG tablet Take 1 tablet by mouth every 4 (four) hours as needed. 10/25/16   Evalee Jefferson, PA-C  ondansetron (ZOFRAN) 4 MG tablet Take 1 tablet (4 mg total) by mouth every 8 (eight) hours as needed. 07/27/20   Rolland Porter, MD  potassium chloride SA (KLOR-CON) 20 MEQ tablet Take 1 tablet (20 mEq total) by mouth 2 (two) times daily. 07/27/20   Rolland Porter, MD  predniSONE (DELTASONE) 10 MG tablet 5,4,3,2,1 - take with food 10/21/13   Lily Kocher, PA-C  triamcinolone cream (KENALOG) 0.1 % Apply 1 application topically 2 (two) times daily. 10/21/13   Lily Kocher, PA-C      Allergies    Patient has no known allergies.    Review of Systems   Review of Systems  All other systems reviewed and are  negative.   Physical Exam Updated Vital Signs BP (!) 171/94 (BP Location: Right Arm)   Pulse 79   Temp 97.9 F (36.6 C) (Temporal)   Resp 16   Ht 5\' 4"  (1.626 m)   Wt 68 kg   SpO2 97%   BMI 25.75 kg/m  Physical Exam Vitals and nursing note reviewed.  Constitutional:      General: She is not in acute distress.    Appearance: She is well-developed.  HENT:     Head: Normocephalic and atraumatic.  Eyes:     Conjunctiva/sclera: Conjunctivae normal.  Cardiovascular:     Rate and Rhythm: Normal rate and regular rhythm.     Heart sounds: No murmur heard. Pulmonary:     Effort: Pulmonary effort is normal. No respiratory distress.     Breath sounds: Normal breath sounds.  Abdominal:     Palpations: Abdomen is soft.     Tenderness: There is no abdominal tenderness.  Musculoskeletal:        General: No swelling.     Cervical back: Neck supple.  Skin:    General: Skin is warm and dry.     Capillary Refill: Capillary refill takes less than 2 seconds.     Comments: Patient with 7.0-7.5 centimeter laceration noted on right posterior calf.  Second 2.6 cm linear laceration noted just beside 1 aforementioned.  No obvious foreign body appreciated.  Patient has full range of motion  of right ankle and plantar/dorsiflexion as well as inversion/eversion.  No obvious muscular involvement.  No sensory deficits distally.  Pedal pulses 2+ bilaterally.  Neurological:     Mental Status: She is alert.  Psychiatric:        Mood and Affect: Mood normal.     ED Results / Procedures / Treatments   Labs (all labs ordered are listed, but only abnormal results are displayed) Labs Reviewed - No data to display  EKG None  Radiology DG Tibia/Fibula Right  Result Date: 12/30/2022 CLINICAL DATA:  Status post dog bite. EXAM: RIGHT TIBIA AND FIBULA - 2 VIEW COMPARISON:  None Available. FINDINGS: There is no evidence of fracture or other focal bone lesions. 1.7 cm, 1.1 cm and 1.4 cm superficial soft  tissue defects are seen along the posterolateral aspect of the mid right calf. IMPRESSION: Posterolateral right calf soft tissue defects without evidence of an acute osseous abnormality. Electronically Signed   By: Virgina Norfolk M.D.   On: 12/30/2022 18:06    Procedures .Marland KitchenLaceration Repair  Date/Time: 12/30/2022 7:32 PM  Performed by: Wilnette Kales, PA Authorized by: Wilnette Kales, PA   Consent:    Consent obtained:  Verbal   Consent given by:  Patient   Risks, benefits, and alternatives were discussed: yes     Risks discussed:  Infection, need for additional repair, nerve damage, poor wound healing, poor cosmetic result, pain, retained foreign body, tendon damage and vascular damage   Alternatives discussed:  No treatment, delayed treatment, observation and referral Universal protocol:    Procedure explained and questions answered to patient or proxy's satisfaction: yes     Patient identity confirmed:  Verbally with patient Anesthesia:    Anesthesia method:  Local infiltration   Local anesthetic:  Lidocaine 2% WITH epi Laceration details:    Location:  Leg   Leg location:  R lower leg   Length (cm):  7 Pre-procedure details:    Preparation:  Imaging obtained to evaluate for foreign bodies and patient was prepped and draped in usual sterile fashion Exploration:    Limited defect created (wound extended): no     Hemostasis achieved with:  Direct pressure   Imaging obtained: x-ray     Imaging outcome: foreign body not noted     Wound exploration: wound explored through full range of motion and entire depth of wound visualized     Contaminated: no   Treatment:    Area cleansed with:  Saline and povidone-iodine   Amount of cleaning:  Extensive   Irrigation solution:  Sterile saline   Irrigation volume:  1000 cc   Irrigation method:  Syringe   Visualized foreign bodies/material removed: no     Debridement:  None   Undermining:  None   Scar revision: no      Layers/structures repaired:  Deep dermal/superficial fascia Deep dermal/superficial fascia:    Suture size:  4-0   Suture material:  Plain gut   Suture technique:  Simple interrupted   Number of sutures:  3 Skin repair:    Repair method:  Sutures   Suture size:  4-0   Suture material:  Nylon   Suture technique:  Simple interrupted   Number of sutures:  5 Approximation:    Approximation:  Loose Repair type:    Repair type:  Intermediate Post-procedure details:    Dressing:  Adhesive bandage   Procedure completion:  Tolerated well, no immediate complications .Marland KitchenLaceration Repair  Date/Time: 12/30/2022 7:34 PM  Performed by: Wilnette Kales, PA Authorized by: Wilnette Kales, PA   Consent:    Consent obtained:  Verbal   Consent given by:  Patient   Risks, benefits, and alternatives were discussed: yes     Risks discussed:  Infection, need for additional repair, nerve damage, pain, poor cosmetic result, retained foreign body, tendon damage, poor wound healing and vascular damage   Alternatives discussed:  No treatment, delayed treatment, observation and referral Universal protocol:    Procedure explained and questions answered to patient or proxy's satisfaction: yes     Patient identity confirmed:  Verbally with patient Anesthesia:    Anesthesia method:  Local infiltration   Local anesthetic:  Lidocaine 2% WITH epi Laceration details:    Location:  Leg   Leg location:  R lower leg   Length (cm):  2.6 Pre-procedure details:    Preparation:  Patient was prepped and draped in usual sterile fashion and imaging obtained to evaluate for foreign bodies Exploration:    Limited defect created (wound extended): no     Hemostasis achieved with:  Direct pressure   Imaging obtained: x-ray     Imaging outcome: foreign body not noted     Wound exploration: wound explored through full range of motion and entire depth of wound visualized     Contaminated: no   Treatment:    Area  cleansed with:  Povidone-iodine and saline   Irrigation solution:  Sterile saline   Irrigation volume:  500cc   Irrigation method:  Syringe   Visualized foreign bodies/material removed: no     Debridement:  None   Undermining:  None   Scar revision: no   Skin repair:    Repair method:  Sutures   Suture size:  4-0   Suture material:  Nylon   Suture technique:  Simple interrupted   Number of sutures:  3 Approximation:    Approximation:  Loose Repair type:    Repair type:  Simple Post-procedure details:    Dressing:  Adhesive bandage   Procedure completion:  Tolerated well, no immediate complications     Medications Ordered in ED Medications  lidocaine-EPINEPHrine (XYLOCAINE W/EPI) 2 %-1:200000 (PF) injection 10 mL (10 mLs Infiltration Given by Other 12/30/22 1805)  Tdap (BOOSTRIX) injection 0.5 mL (0.5 mLs Intramuscular Given 12/30/22 1747)  rabies immune globulin (HYPERRAB/KEDRAB) injection 1,350 Units (1,350 Units Infiltration Given 12/30/22 1818)  rabies vaccine (RABAVERT) injection 1 mL (1 mL Intramuscular Given 12/30/22 1801)  oxyCODONE-acetaminophen (PERCOCET/ROXICET) 5-325 MG per tablet 1 tablet (1 tablet Oral Given 12/30/22 1746)  amoxicillin-clavulanate (AUGMENTIN) 875-125 MG per tablet 1 tablet (1 tablet Oral Given 12/30/22 1904)    ED Course/ Medical Decision Making/ A&P                             Medical Decision Making Amount and/or Complexity of Data Reviewed Radiology: ordered.  Risk Prescription drug management.   This patient presents to the ED for concern of laceration, this involves an extensive number of treatment options, and is a complaint that carries with it a high risk of complications and morbidity.  The differential diagnosis includes foreign body retainment, ligamentous/tendinous injury, muscular damage, acute fracture/dislocation   Co morbidities that complicate the patient evaluation  See HPI   Additional history obtained:  Additional  history obtained from EMR External records from outside source obtained and reviewed including hospital records   Lab Tests:  N/a  Imaging Studies ordered:  I ordered imaging studies including x-ray right tib-fib/fib I independently visualized and interpreted imaging which showed posterior lateral soft tissue calf defects without evidence of osseous abnormality or foreign body I agree with the radiologist interpretation  Cardiac Monitoring: / EKG:  The patient was maintained on a cardiac monitor.  I personally viewed and interpreted the cardiac monitored which showed an underlying rhythm of: Sinus rhythm   Consultations Obtained:  N/a   Problem List / ED Course / Critical interventions / Medication management  Dog bite/laceration I ordered medication including Augmentin, rabies vaccine, Tdap, oxycodone, lidocaine with epinephrine, rabies immunoglobulin   Reevaluation of the patient after these medicines showed that the patient improved I have reviewed the patients home medicines and have made adjustments as needed   Social Determinants of Health:  Chronic cigarette use   Test / Admission - Considered:  Dog bite/laceration Vitals signs significant for hypertension. Otherwise within normal range and stable throughout visit. Laboratory/imaging studies significant for: See above Patient with evidence of 2 lacerations post dog bite.  Patient unaware of vaccination status of the dog's rabies vaccination series as well as immunoglobulin was administered while in the emergency department.  Patient also updated on tetanus.  Lacerations repaired loosely in manner as depicted above.  Patient educated regarding wound care for affected wound.  Will be placed on Augmentin for prophylactic antibiotic therapy given dog bite with partial closure of wound.  Patient educated regarding signs of infection to look out for.  Patient recommended Tylenol/Motrin as needed for baseline pain.   Follow-up regardless recommended within 2 to 3 days for wound reassessment by primary care provider.  Treatment plan discussed at length with patient and she acknowledged understanding was agreeable to said plan.  Follow-up for rabies vaccination series discussed with patient as well as follow-up in 7 to 10 days for possible suture removal. Worrisome signs and symptoms were discussed with the patient, and the patient acknowledged understanding to return to the ED if noticed. Patient was stable upon discharge.          Final Clinical Impression(s) / ED Diagnoses Final diagnoses:  Dog bite, initial encounter  Laceration of right lower extremity, initial encounter    Rx / DC Orders ED Discharge Orders          Ordered    amoxicillin-clavulanate (AUGMENTIN) 875-125 MG tablet  Every 12 hours        12/30/22 1920              Wilnette Kales, [place] 12/30/22 1937    Hayden Rasmussen, MD 12/31/22 1524

## 2023-01-02 ENCOUNTER — Ambulatory Visit
Admission: EM | Admit: 2023-01-02 | Discharge: 2023-01-02 | Disposition: A | Discharge status: Home / Self Care | Payer: Self-pay | Attending: Family Medicine | Admitting: Family Medicine

## 2023-01-02 ENCOUNTER — Encounter: Payer: Self-pay | Admitting: Emergency Medicine

## 2023-01-02 ENCOUNTER — Other Ambulatory Visit: Payer: Self-pay

## 2023-01-02 DIAGNOSIS — Z203 Contact with and (suspected) exposure to rabies: Secondary | ICD-10-CM

## 2023-01-02 MED ORDER — RABIES VACCINE, PCEC IM SUSR
1.0000 mL | Freq: Once | INTRAMUSCULAR | Status: AC
  Administered 2023-01-02: 1 mL via INTRAMUSCULAR

## 2023-01-02 NOTE — ED Notes (Addendum)
Leg dressing fell off during UC visit. Pt requested to redress site. Non adherent pad secured with coban applied to site.   Site management and infection prevention reviewed. Pt verbalized understanding.  Upcoming dates for 3rd and 4th dose of rabies series reviewed and written down for pt.

## 2023-01-02 NOTE — ED Triage Notes (Signed)
Pt presents to UC for second dose of rabies vaccine. Pt denies any reactions to previous injections or any questions related to bite site/healing at this time.

## 2023-01-06 ENCOUNTER — Ambulatory Visit
Admission: EM | Admit: 2023-01-06 | Discharge: 2023-01-06 | Disposition: A | Discharge status: Home / Self Care | Payer: Self-pay | Attending: Nurse Practitioner | Admitting: Nurse Practitioner

## 2023-01-06 DIAGNOSIS — Z203 Contact with and (suspected) exposure to rabies: Secondary | ICD-10-CM

## 2023-01-06 MED ORDER — RABIES VACCINE, PCEC IM SUSR
1.0000 mL | Freq: Once | INTRAMUSCULAR | Status: AC
  Administered 2023-01-06: 1 mL via INTRAMUSCULAR

## 2023-01-06 NOTE — ED Triage Notes (Signed)
Pt presents for Rabies vaccine #3.   

## 2023-01-13 ENCOUNTER — Encounter: Payer: Self-pay | Admitting: Emergency Medicine

## 2023-01-13 ENCOUNTER — Ambulatory Visit
Admission: EM | Admit: 2023-01-13 | Discharge: 2023-01-13 | Disposition: A | Discharge status: Home / Self Care | Payer: Self-pay | Attending: Nurse Practitioner | Admitting: Nurse Practitioner

## 2023-01-13 DIAGNOSIS — Z203 Contact with and (suspected) exposure to rabies: Secondary | ICD-10-CM

## 2023-01-13 MED ORDER — RABIES VACCINE, PCEC IM SUSR
1.0000 mL | Freq: Once | INTRAMUSCULAR | Status: AC
  Administered 2023-01-13: 1 mL via INTRAMUSCULAR

## 2023-01-13 NOTE — ED Triage Notes (Signed)
Here for 4th rabies vaccine 

## 2023-04-07 ENCOUNTER — Encounter (HOSPITAL_COMMUNITY): Payer: Self-pay | Admitting: Emergency Medicine

## 2023-04-07 ENCOUNTER — Emergency Department (HOSPITAL_COMMUNITY)
Admission: EM | Admit: 2023-04-07 | Discharge: 2023-04-07 | Disposition: A | Discharge status: Home / Self Care | Payer: Self-pay | Attending: Student | Admitting: Student

## 2023-04-07 ENCOUNTER — Other Ambulatory Visit: Payer: Self-pay

## 2023-04-07 DIAGNOSIS — T63441A Toxic effect of venom of bees, accidental (unintentional), initial encounter: Secondary | ICD-10-CM | POA: Insufficient documentation

## 2023-04-07 DIAGNOSIS — F17210 Nicotine dependence, cigarettes, uncomplicated: Secondary | ICD-10-CM | POA: Insufficient documentation

## 2023-04-07 MED ORDER — DEXAMETHASONE SODIUM PHOSPHATE 10 MG/ML IJ SOLN
10.0000 mg | Freq: Once | INTRAMUSCULAR | Status: AC
  Administered 2023-04-07: 10 mg via INTRAMUSCULAR
  Filled 2023-04-07: qty 1

## 2023-04-07 NOTE — ED Provider Notes (Signed)
[name] EMERGENCY DEPARTMENT AT [facility] Provider Note  CSN: [id number] Arrival date & time: 04/07/23 1357  Chief Complaint(s) Insect Bite  HPI Rebecca Hurst is a 62 y.o. female who presents emergency department for evaluation of eye swelling after bee sting.  Patient states that yesterday she was stung by bee in the lateral orbit and the swelling caused the eye to swell shut.  She did not have any anticholinergics at home but a neighbor brought her some Benadryl this morning and over the last few hours the swelling has started to significantly improved.  She denies any pruritus, urticaria, wheezing, oropharyngeal swelling or any additional symptoms today.  No visual deficit or eye pain.   Past Medical History Past Medical History:  Diagnosis Date   Alcohol abuse    There are no problems to display for this patient.  Home Medication(s) Prior to Admission medications   Medication Sig Start Date End Date Taking? Authorizing Provider  amoxicillin-clavulanate (AUGMENTIN) 875-125 MG tablet Take 1 tablet by mouth every 12 (twelve) hours. 12/30/22   Peter Garter, PA  HYDROcodone-acetaminophen (NORCO/VICODIN) 5-325 MG tablet Take 1 tablet by mouth every 4 (four) hours as needed. 10/25/16   Burgess Amor, PA-C  ondansetron (ZOFRAN) 4 MG tablet Take 1 tablet (4 mg total) by mouth every 8 (eight) hours as needed. 07/27/20   Devoria Albe, MD  potassium chloride SA (KLOR-CON) 20 MEQ tablet Take 1 tablet (20 mEq total) by mouth 2 (two) times daily. 07/27/20   Devoria Albe, MD  predniSONE (DELTASONE) 10 MG tablet 5,4,3,2,1 - take with food 10/21/13   Ivery Quale, PA-C  triamcinolone cream (KENALOG) 0.1 % Apply 1 application topically 2 (two) times daily. 10/21/13   Ivery Quale, PA-C                                                                                                                                    Past Surgical History History reviewed. No pertinent surgical  history. Family History History reviewed. No pertinent family history.  Social History Social History   Tobacco Use   Smoking status: Some Days    Packs/day: .5    Types: Cigarettes   Smokeless tobacco: Never  Substance Use Topics   Alcohol use: No   Drug use: No   Allergies Patient has no known allergies.  Review of Systems Review of Systems  HENT:  Positive for facial swelling.     Physical Exam Vital Signs  I have reviewed the triage vital signs BP (!) 201/76   Pulse (!) 53   Temp 98.3 F (36.8 C) (Oral)   Resp 15   Ht 5\' 4"  (1.626 m)   Wt 61.2 kg   LMP 07/30/2013 Comment: pt reports sporadic periods  SpO2 100%   BMI 23.17 kg/m   Physical Exam Vitals and nursing note reviewed.  Constitutional:      General: She is not  in acute distress.    Appearance: She is well-developed.  HENT:     Head: Normocephalic and atraumatic.     Comments: Left periorbital swelling Eyes:     Conjunctiva/sclera: Conjunctivae normal.  Cardiovascular:     Rate and Rhythm: Normal rate and regular rhythm.     Heart sounds: No murmur heard. Pulmonary:     Effort: Pulmonary effort is normal. No respiratory distress.     Breath sounds: Normal breath sounds.  Abdominal:     Palpations: Abdomen is soft.     Tenderness: There is no abdominal tenderness.  Musculoskeletal:        General: No swelling.     Cervical back: Neck supple.  Skin:    General: Skin is warm and dry.     Capillary Refill: Capillary refill takes less than 2 seconds.  Neurological:     Mental Status: She is alert.  Psychiatric:        Mood and Affect: Mood normal.     ED Results and Treatments Labs (all labs ordered are listed, but only abnormal results are displayed) Labs Reviewed - No data to display                                                                                                                        Radiology No results found.  Pertinent labs & imaging results that were available  during my care of the patient were reviewed by me and considered in my medical decision making (see MDM for details).  Medications Ordered in ED Medications  dexamethasone (DECADRON) injection 10 mg (10 mg Intramuscular Given 04/07/23 1604)                                                                                                                                     Procedures Procedures  (including critical care time)  Medical Decision Making / ED Course   This patient presents to the ED for concern of eye swelling, this involves an extensive number of treatment options, and is a complaint that carries with it a high risk of complications and morbidity.  The differential diagnosis includes edema secondary to histamine reaction, hymenoptera sting, fracture, periorbital cellulitis  MDM: Patient seen emergency room for evaluation of a bee sting.  Physical exam with some mild periorbital swelling on the left that appears to be improving after Benadryl use at home.  No additional physical exam evidence of anaphylaxis including no rash or wheezing.  Patient will be given single dose IM Decadron here and will use Benadryl at home until resolution of symptoms.  Of note, patient is hypertensive on arrival and patient states that she has known whitecoat hypertension and feels very anxious.  Patient's presentation today is not related to elevated blood pressure today and current ER literature advises against aggressive blood pressure control in the emergency department in the absence of symptoms as this is not shown to have clinical benefit and should be managed in the outpatient setting Archie Balboa et al 2023).  Patient currently does not meet inpatient criteria for admission and she is safe for discharge with outpatient follow-up  Additional history obtained: -Additional history obtained from neighbor -External records from outside source obtained and reviewed including: Chart review including previous  notes, labs, imaging, consultation notes    Medicines ordered and prescription drug management: Meds ordered this encounter  Medications   dexamethasone (DECADRON) injection 10 mg    -I have reviewed the patients home medicines and have made adjustments as needed  Critical interventions none   Cardiac Monitoring: The patient was maintained on a cardiac monitor.  I personally viewed and interpreted the cardiac monitored which showed an underlying rhythm of: NSR  Social Determinants of Health:  Factors impacting patients care include: none   Reevaluation: After the interventions noted above, I reevaluated the patient and found that they have :improved  Co morbidities that complicate the patient evaluation  Past Medical History:  Diagnosis Date   Alcohol abuse       Dispostion: I considered admission for this patient, but at this time she does not meet inpatient criteria for admission she is safe for discharge with outpatient follow-up     Final Clinical Impression(s) / ED Diagnoses Final diagnoses:  Bee sting, accidental or unintentional, initial encounter     @PCDICTATION @    Glendora Score, MD 04/07/23 [phone]

## 2023-04-07 NOTE — ED Triage Notes (Addendum)
Pt c/o bee sting to left eye yesterday, treated with benadryl and ice this morning. Pt denies bee allergy. No changes to vision. Area around left eye is reddened and obviously swollen. Elevated BP noted in triage 201/76; pt denies hx HTN and states she feels nervous coming to the hospital.

## 2023-05-07 ENCOUNTER — Other Ambulatory Visit: Payer: Self-pay

## 2023-05-07 ENCOUNTER — Ambulatory Visit
Admission: EM | Admit: 2023-05-07 | Discharge: 2023-05-07 | Disposition: A | Discharge status: Home / Self Care | Payer: Self-pay | Attending: Family Medicine | Admitting: Family Medicine

## 2023-05-07 DIAGNOSIS — R55 Syncope and collapse: Secondary | ICD-10-CM

## 2023-05-07 LAB — POCT FASTING CBG KUC MANUAL ENTRY: POCT Glucose (KUC): 104 mg/dL — AB (ref 70–99)

## 2023-05-07 NOTE — Discharge Instructions (Signed)
It is very important that you obtain a primary care provider as soon as possible, both to follow-up on this issue but also to follow your healthcare on an ongoing maintenance basis.  You may go to the [facility] health website and click on the find a provider tab to establish an appointment with a primary care provider.  To the emergency department if you experience a similar episode going forward for further evaluation

## 2023-05-07 NOTE — ED Triage Notes (Signed)
Today at a cookout patient began to get dizzy and stated she was about to fall but family members were able to catch her. Patient is A&Ox4 and denies having any pain.   Pt denies dizziness at this time but does report having some weakness. There is a family member at bedside.

## 2023-05-07 NOTE — ED Provider Notes (Signed)
[facility]    CSN: [id number] Arrival date & time: 05/07/23  1337      History   Chief Complaint Chief Complaint  Patient presents with   Dizziness    HPI Rebecca Hurst is a 62 y.o. female.   Patient presenting today with a family member for evaluation of a syncopal episode that occurred earlier this afternoon.  Family member states they were at a cookout and patient went to stand up and started to pass out.  She was caught by family members so did not ever hit the ground but she reports that patient was out of consciousness for about 30 minutes.  EMS came, evaluated her on the scene and provided her something to eat and drink patient states that she fairly quickly started feeling back to normal health status.  She denies biting her tongue or urinating on self during episode and did not have any confusion upon waking.  She states she felt a bit dizzy when she went to stand up just prior to this occurrence, otherwise has been feeling her usual state of health all day and does not have any symptoms currently.  She did not eat prior to this episode but she states is not abnormal for her.  She states she does not take any chronic medications and has not tried any new supplements or diet changes recently.  No known chronic medical problems per patient.    Past Medical History:  Diagnosis Date   Alcohol abuse     There are no problems to display for this patient.   History reviewed. No pertinent surgical history.  OB History   No obstetric history on file.      Home Medications    Prior to Admission medications   Medication Sig Start Date End Date Taking? Authorizing Provider  amoxicillin-clavulanate (AUGMENTIN) 875-125 MG tablet Take 1 tablet by mouth every 12 (twelve) hours. 12/30/22   Peter Garter, PA  HYDROcodone-acetaminophen (NORCO/VICODIN) 5-325 MG tablet Take 1 tablet by mouth every 4 (four) hours as needed. 10/25/16   Burgess Amor, PA-C  ondansetron  (ZOFRAN) 4 MG tablet Take 1 tablet (4 mg total) by mouth every 8 (eight) hours as needed. 07/27/20   Devoria Albe, MD  potassium chloride SA (KLOR-CON) 20 MEQ tablet Take 1 tablet (20 mEq total) by mouth 2 (two) times daily. 07/27/20   Devoria Albe, MD  predniSONE (DELTASONE) 10 MG tablet 5,4,3,2,1 - take with food 10/21/13   Ivery Quale, PA-C  triamcinolone cream (KENALOG) 0.1 % Apply 1 application topically 2 (two) times daily. 10/21/13   Ivery Quale, PA-C    Family History History reviewed. No pertinent family history.  Social History Social History   Tobacco Use   Smoking status: Some Days    Current packs/day: 0.50    Types: Cigarettes   Smokeless tobacco: Never  Substance Use Topics   Alcohol use: No   Drug use: No     Allergies   Patient has no known allergies.   Review of Systems Review of Systems Per HPI  Physical Exam Triage Vital Signs ED Triage Vitals  Encounter Vitals Group     BP 05/07/23 1347 (!) 159/65     Systolic BP Percentile --      Diastolic BP Percentile --      Pulse Rate 05/07/23 1347 (!) 56     Resp 05/07/23 1347 16     Temp 05/07/23 1347 98.3 F (36.8 C)  Temp Source 05/07/23 1347 Oral     SpO2 05/07/23 1347 97 %     Weight --      Height --      Head Circumference --      Peak Flow --      Pain Score 05/07/23 1346 0     Pain Loc --      Pain Education --      Exclude from Growth Chart --    Orthostatic VS for the past 24 hrs:  BP- Lying Pulse- Lying BP- Sitting Pulse- Sitting BP- Standing at 0 minutes Pulse- Standing at 0 minutes  05/07/23 1418 148/74 60 156/71 64 158/74 58    Updated Vital Signs BP (!) 159/65 (BP Location: Right Arm)   Pulse (!) 56   Temp 98.3 F (36.8 C) (Oral)   Resp 16   LMP 07/30/2013 Comment: pt reports sporadic periods  SpO2 97%   Visual Acuity Right Eye Distance:   Left Eye Distance:   Bilateral Distance:    Right Eye Near:   Left Eye Near:    Bilateral Near:     Physical Exam Vitals  and nursing note reviewed.  Constitutional:      Appearance: Normal appearance. She is not ill-appearing.  HENT:     Head: Atraumatic.     Mouth/Throat:     Mouth: Mucous membranes are moist.  Eyes:     Extraocular Movements: Extraocular movements intact.     Conjunctiva/sclera: Conjunctivae normal.     Pupils: Pupils are equal, round, and reactive to light.  Cardiovascular:     Rate and Rhythm: Regular rhythm. Bradycardia present.     Heart sounds: Normal heart sounds.  Pulmonary:     Effort: Pulmonary effort is normal.     Breath sounds: Normal breath sounds. No wheezing or rales.  Musculoskeletal:        General: Normal range of motion.     Cervical back: Normal range of motion and neck supple.  Skin:    General: Skin is warm and dry.  Neurological:     Mental Status: She is alert. Mental status is at baseline.     Cranial Nerves: No cranial nerve deficit.     Motor: No weakness.     Gait: Gait normal.     Comments: At baseline per family member who is with her today.  Does have a bit of a blank affect observed today but answers questions appropriately when she does speak  Psychiatric:        Mood and Affect: Mood normal.        Thought Content: Thought content normal.        Judgment: Judgment normal.      UC Treatments / Results  Labs (all labs ordered are listed, but only abnormal results are displayed) Labs Reviewed  POCT FASTING CBG [facility] MANUAL ENTRY - Abnormal; Notable for the following components:      Result Value   POCT Glucose ([facility]) 104 (*)    All other components within normal limits  COMPREHENSIVE METABOLIC PANEL  CBC WITH DIFFERENTIAL/PLATELET    EKG   Radiology No results found.  Procedures Procedures (including critical care time)  Medications Ordered in UC Medications - No data to display  Initial Impression / Assessment and Plan / UC Course  I have reviewed the triage vital signs and the nursing notes.  Pertinent labs & imaging results  that were available during my care of the patient were reviewed  by me and considered in my medical decision making (see chart for details).     No obvious focal deficits on exam, EKG today showing normal sinus rhythm at 60 bpm with no ST elevations or T wave changes as compared to previous EKG from 2021.  Point-of-care glucose is 104 random, labs pending for further evaluation.  Did discuss the limitations of our capabilities of working this problem up which patient and member were comfortable with.  She does not currently appear primary care provider, resources given regarding establishing with 1.  Go to the emergency department for any further symptoms.  45-minute spent today in direct patient care, evaluation and education  Final Clinical Impressions(s) / UC Diagnoses   Final diagnoses:  Syncope, unspecified syncope type     Discharge Instructions      It is very important that you obtain a primary care provider as soon as possible, both to follow-up on this issue but also to follow your healthcare on an ongoing maintenance basis.  You may go to the [facility] health website and click on the find a provider tab to establish an appointment with a primary care provider.  To the emergency department if you experience a similar episode going forward for further evaluation    ED Prescriptions   None    PDMP not reviewed this encounter.   Particia Nearing, [place] 05/07/23 1500

## 2023-05-07 NOTE — ED Notes (Signed)
Patient currently getting checked in by patient access.

## 2023-05-08 LAB — COMPREHENSIVE METABOLIC PANEL WITH GFR
ALT: 10 IU/L (ref 0–32)
AST: 16 IU/L (ref 0–40)
Albumin: 4.2 g/dL (ref 3.9–4.9)
Alkaline Phosphatase: 103 IU/L (ref 44–121)
BUN/Creatinine Ratio: 12 (ref 12–28)
BUN: 10 mg/dL (ref 8–27)
Bilirubin Total: 0.3 mg/dL (ref 0.0–1.2)
CO2: 26 mmol/L (ref 20–29)
Calcium: 9.7 mg/dL (ref 8.7–10.3)
Chloride: 106 mmol/L (ref 96–106)
Creatinine, Ser: 0.81 mg/dL (ref 0.57–1.00)
Globulin, Total: 2.9 g/dL (ref 1.5–4.5)
Glucose: 79 mg/dL (ref 70–99)
Potassium: 4.3 mmol/L (ref 3.5–5.2)
Sodium: 144 mmol/L (ref 134–144)
Total Protein: 7.1 g/dL (ref 6.0–8.5)
eGFR: 83 mL/min/{1.73_m2} (ref >59)

## 2023-05-08 LAB — CBC WITH DIFFERENTIAL/PLATELET
Basophils Absolute: 0 10*3/uL (ref 0.0–0.2)
Basos: 1 %
EOS (ABSOLUTE): 0 10*3/uL (ref 0.0–0.4)
Eos: 1 %
Hematocrit: 38 % (ref 34.0–46.6)
Hemoglobin: 12.2 g/dL (ref 11.1–15.9)
Immature Grans (Abs): 0 10*3/uL (ref 0.0–0.1)
Immature Granulocytes: 0 %
Lymphocytes Absolute: 1.3 10*3/uL (ref 0.7–3.1)
Lymphs: 20 %
MCH: 31 pg (ref 26.6–33.0)
MCHC: 32.1 g/dL (ref 31.5–35.7)
MCV: 97 fL (ref 79–97)
Monocytes Absolute: 0.4 10*3/uL (ref 0.1–0.9)
Monocytes: 6 %
Neutrophils Absolute: 4.7 10*3/uL (ref 1.4–7.0)
Neutrophils: 72 %
Platelets: 246 10*3/uL (ref 150–450)
RBC: 3.93 x10E6/uL (ref 3.77–5.28)
RDW: 12.4 % (ref 11.7–15.4)
WBC: 6.4 10*3/uL (ref 3.4–10.8)
